# Patient Record
Sex: Female | Born: 1985 | Race: Black or African American | Hispanic: No | Marital: Single | Smoking: Former smoker
Health system: Southern US, Community
[De-identification: ages and names within clinical notes are randomized; demographics above are authoritative.]

## PROBLEM LIST (undated history)

## (undated) ENCOUNTER — Inpatient Hospital Stay (HOSPITAL_COMMUNITY): Payer: Self-pay

## (undated) ENCOUNTER — Ambulatory Visit (HOSPITAL_BASED_OUTPATIENT_CLINIC_OR_DEPARTMENT_OTHER): Discharge status: Absent without leave | Payer: Medicaid Other

## (undated) DIAGNOSIS — J45909 Unspecified asthma, uncomplicated: Secondary | ICD-10-CM

## (undated) HISTORY — PX: NO PAST SURGERIES: SHX2092

---

## 2015-09-27 NOTE — L&D Delivery Note (Signed)
Delivery Note At 5:39 PM a viable female was delivered via Vaginal, Spontaneous Delivery (Presentation: ;  ).  APGAR:9 ,9 ; weight  .   Placenta status: , .  Cord:  with the following complications: .  Cord pH: not sent  Anesthesia:  Epidural Episiotomy:  none Lacerations:  none Suture Repair: none Est. Blood Loss (mL):  200 ml  Mom to postpartum.  Baby to Couplet care / Skin to Skin.  Kathey Simer A 07/04/2016, 5:51 PM

## 2016-01-13 LAB — OB RESULTS CONSOLE HEPATITIS B SURFACE ANTIGEN: Hepatitis B Surface Ag: NEGATIVE

## 2016-01-13 LAB — OB RESULTS CONSOLE GC/CHLAMYDIA
Chlamydia: NEGATIVE
GC PROBE AMP, GENITAL: NEGATIVE

## 2016-01-13 LAB — OB RESULTS CONSOLE ABO/RH: RH Type: POSITIVE

## 2016-01-13 LAB — OB RESULTS CONSOLE RPR: RPR: NONREACTIVE

## 2016-01-13 LAB — OB RESULTS CONSOLE ANTIBODY SCREEN: Antibody Screen: NEGATIVE

## 2016-01-13 LAB — OB RESULTS CONSOLE RUBELLA ANTIBODY, IGM: Rubella: IMMUNE

## 2016-01-13 LAB — OB RESULTS CONSOLE HIV ANTIBODY (ROUTINE TESTING): HIV: NONREACTIVE

## 2016-03-21 ENCOUNTER — Other Ambulatory Visit (HOSPITAL_COMMUNITY): Payer: Self-pay | Admitting: Obstetrics and Gynecology

## 2016-03-21 ENCOUNTER — Ambulatory Visit (HOSPITAL_COMMUNITY)
Admission: RE | Admit: 2016-03-21 | Discharge: 2016-03-21 | Disposition: A | Discharge status: Home / Self Care | Payer: Medicaid Other | Source: Ambulatory Visit | Attending: Obstetrics and Gynecology | Admitting: Obstetrics and Gynecology

## 2016-03-21 ENCOUNTER — Encounter (HOSPITAL_COMMUNITY): Payer: Self-pay

## 2016-03-21 DIAGNOSIS — O359XX0 Maternal care for (suspected) fetal abnormality and damage, unspecified, not applicable or unspecified: Secondary | ICD-10-CM | POA: Diagnosis not present

## 2016-03-21 DIAGNOSIS — O0932 Supervision of pregnancy with insufficient antenatal care, second trimester: Secondary | ICD-10-CM | POA: Diagnosis not present

## 2016-03-21 DIAGNOSIS — Z3A23 23 weeks gestation of pregnancy: Secondary | ICD-10-CM

## 2016-03-21 DIAGNOSIS — Z36 Encounter for antenatal screening of mother: Secondary | ICD-10-CM | POA: Insufficient documentation

## 2016-03-21 DIAGNOSIS — O99212 Obesity complicating pregnancy, second trimester: Secondary | ICD-10-CM

## 2016-03-21 DIAGNOSIS — Z3689 Encounter for other specified antenatal screening: Secondary | ICD-10-CM

## 2016-04-06 ENCOUNTER — Encounter (HOSPITAL_COMMUNITY): Payer: Self-pay

## 2016-05-27 ENCOUNTER — Inpatient Hospital Stay (HOSPITAL_COMMUNITY)
Admission: AD | Admit: 2016-05-27 | Discharge: 2016-05-27 | Disposition: A | Discharge status: Home / Self Care | Payer: Medicaid Other | Source: Ambulatory Visit | Attending: Obstetrics and Gynecology | Admitting: Obstetrics and Gynecology

## 2016-05-27 ENCOUNTER — Encounter (HOSPITAL_COMMUNITY): Payer: Self-pay

## 2016-05-27 DIAGNOSIS — Z79899 Other long term (current) drug therapy: Secondary | ICD-10-CM | POA: Diagnosis not present

## 2016-05-27 DIAGNOSIS — Z87891 Personal history of nicotine dependence: Secondary | ICD-10-CM | POA: Insufficient documentation

## 2016-05-27 DIAGNOSIS — Z3A33 33 weeks gestation of pregnancy: Secondary | ICD-10-CM | POA: Diagnosis not present

## 2016-05-27 DIAGNOSIS — O1210 Gestational proteinuria, unspecified trimester: Secondary | ICD-10-CM

## 2016-05-27 DIAGNOSIS — O1213 Gestational proteinuria, third trimester: Secondary | ICD-10-CM | POA: Diagnosis not present

## 2016-05-27 DIAGNOSIS — Z3493 Encounter for supervision of normal pregnancy, unspecified, third trimester: Secondary | ICD-10-CM

## 2016-05-27 DIAGNOSIS — Z3689 Encounter for other specified antenatal screening: Secondary | ICD-10-CM

## 2016-05-27 DIAGNOSIS — O99513 Diseases of the respiratory system complicating pregnancy, third trimester: Secondary | ICD-10-CM | POA: Insufficient documentation

## 2016-05-27 HISTORY — DX: Unspecified asthma, uncomplicated: J45.909

## 2016-05-27 LAB — URINE MICROSCOPIC-ADD ON
BACTERIA UA: NONE SEEN
RBC / HPF: NONE SEEN RBC/hpf (ref 0–5)

## 2016-05-27 LAB — URINALYSIS, ROUTINE W REFLEX MICROSCOPIC
BILIRUBIN URINE: NEGATIVE
Glucose, UA: NEGATIVE mg/dL
Ketones, ur: 15 mg/dL — AB
Leukocytes, UA: NEGATIVE
NITRITE: NEGATIVE
PH: 7 (ref 5.0–8.0)
Protein, ur: 100 mg/dL — AB
SPECIFIC GRAVITY, URINE: 1.025 (ref 1.005–1.030)

## 2016-05-27 MED ORDER — BETAMETHASONE SOD PHOS & ACET 6 (3-3) MG/ML IJ SUSP: 12.0000 mg | Freq: Once | INTRAMUSCULAR | Status: DC

## 2016-05-27 MED ORDER — BETAMETHASONE SOD PHOS & ACET 6 (3-3) MG/ML IJ SUSP
12.0000 mg | Freq: Once | INTRAMUSCULAR | Status: AC
  Administered 2016-05-27: 12 mg via INTRAMUSCULAR
  Filled 2016-05-27: qty 2

## 2016-05-27 NOTE — MAU Note (Signed)
Had blood work yesterday was called because lab work was abnormal, no history of high blood pressure, no headache, swelling in lower extremities, was told to come in and be monitored, pain on left upper abdomen.

## 2016-05-27 NOTE — MAU Provider Note (Signed)
  History     CSN: [id number]  Arrival date and time: 05/27/16 1246   First Provider Initiated Contact with Patient 05/27/16 1351      No chief complaint on file.  [id number] @33 .1 weeks presenting after she had a message on her voicemail from the office about protein in her urine and instructed to come into hospital. She was in the office yesterday for a routine prenatal visit and urine dip was positive for protein. She reports good FM. No VB, LOF, or ctx. She denies HA, visual disturbances, and epigastric pain.   OB History    Gravida Para Term Preterm AB Living   5 3 3   1 3    SAB TAB Ectopic Multiple Live Births     1            Past Medical History:  Diagnosis Date  . Asthma     History reviewed. No pertinent surgical history.  History reviewed. No pertinent family history.  Social History  Substance Use Topics  . Smoking status: Former Smoker    Quit date: 03/26/2016  . Smokeless tobacco: Never Used  . Alcohol use No    Allergies: No Known Allergies  Prescriptions Prior to Admission  Medication Sig Dispense Refill Last Dose  . acetaminophen (TYLENOL) 500 MG tablet Take 500 mg by mouth every 6 (six) hours as needed.   05/25/2016  . albuterol (PROVENTIL HFA;VENTOLIN HFA) 108 (90 Base) MCG/ACT inhaler Inhale 2 puffs into the lungs every 6 (six) hours as needed for wheezing or shortness of breath.     . calcium carbonate (TUMS - DOSED IN MG ELEMENTAL CALCIUM) 500 MG chewable tablet Chew 1 tablet by mouth daily.   05/25/2016  . Prenatal Vit-Fe Fumarate-FA (PRENATAL MULTIVITAMIN) TABS tablet Take 1 tablet by mouth daily at 12 noon.   05/27/2016 at Unknown time    Review of Systems  Constitutional: Negative.   HENT: Negative.   Gastrointestinal: Negative.    Physical Exam   Blood pressure 114/62, pulse 99, temperature 98.3 F (36.8 C), temperature source Oral, resp. rate 16, height 5\' 7"  (1.702 m), weight 110.7 kg (244 lb 1.9 oz).  Physical Exam  Constitutional: She is  oriented to person, place, and time. She appears well-developed and well-nourished.  HENT:  Head: Normocephalic and atraumatic.  Neck: Normal range of motion. Neck supple.  Cardiovascular: Normal rate and regular rhythm.   Respiratory: Effort normal and breath sounds normal.  GI: Soft. Bowel sounds are normal. She exhibits no distension. There is no tenderness.  gravid  Musculoskeletal: Normal range of motion.  Neurological: She is alert and oriented to person, place, and time.  Skin: Skin is warm and dry.  Psychiatric: She has a normal mood and affect.  EFM: 145 bpm, mod variability, + accels, no decels Toco: none  MAU Course  Procedures Betamethasone 12 mg IM x1  MDM Labs ordered and reviewed. Dr. Stefano GaulStringer in to see pt. No evidence of Pre-e based on physical exam, BPs and yesterdays labs. Plan for BMZ today and tomorrow. Stable for discharge home  Assessment and Plan  33.[redacted] weeks gestation Reactive NST Proteinuria  Discharge home Pre-e precautions Follow up tomorrow for BMZ injection in MAU Follow up in office next week as scheduled  Donette LarryMelanie Walter Min, CNM 05/27/2016, 2:02 PM

## 2016-05-27 NOTE — MAU Provider Note (Signed)
DATE: 05/27/2016  Maternity Admissions Unit History and Physical Exam for an Obstetrics Patient  [name] is a 30 y.o. female, [id number], at 7352w1d gestation, who presents for evaluation of possible preeclampsia. [facility]  Her pregnancy has been complicated by proteinuria based on a dipstick of her urine on 05/26/2016. The patient had PIH labs drawn in the office. Her platelet count and liver enzymes were normal. Her protein to creatinine ratio was 293 (greater than or equal to 300 is diagnostic of preeclampsia). The patient was instructed to come to the hospital to monitor blood pressures and for further evaluation. The patient denies right upper quadrant tenderness. She does have persistent left lower quadrant pain. She denies headaches and blurred vision. See history below.  OB History    Gravida Para Term Preterm AB Living   5 3 3   1 3    SAB TAB Ectopic Multiple Live Births     1            Past Medical History:  Diagnosis Date  . Asthma     No prescriptions prior to admission.    History reviewed. No pertinent surgical history.  No Known Allergies  Family History: family history is not on file.  Social History:  reports that she quit smoking about 2 months ago. She has never used smokeless tobacco. She reports that she does not drink alcohol or use drugs.  Review of systems: Normal pregnancy complaints.  Admission Physical Exam:    Body mass index is 38.23 kg/m.  Blood pressure 97/71, pulse 96, temperature 98.3 F (36.8 C), temperature source Oral, resp. rate 18, height 5\' 7"  (1.702 m), weight 244 lb 1.9 oz (110.7 kg).  HEENT:                 Within normal limits Chest:                   Clear Heart:                    Regular rate and rhythm Abdomen:             Gravid and nontender Extremities:          Grossly normal Neurologic exam: Grossly  normal  NST: Category 1; Contractions: Mild and irregular .   Results for orders placed or performed during the hospital encounter of 05/27/16 (from the past 24 hour(s))  Urinalysis, Routine w reflex microscopic (not at [facility])     Status: Abnormal   Collection Time: 05/27/16  1:00 PM  Result Value Ref Range   Color, Urine YELLOW YELLOW   APPearance CLEAR CLEAR   Specific Gravity, Urine 1.025 1.005 - 1.030   pH 7.0 5.0 - 8.0   Glucose, UA NEGATIVE NEGATIVE mg/dL   Hgb urine dipstick TRACE (A) NEGATIVE   Bilirubin Urine NEGATIVE NEGATIVE   Ketones, ur 15 (A) NEGATIVE mg/dL   Protein, ur 478100 (A) NEGATIVE mg/dL   Nitrite NEGATIVE NEGATIVE   Leukocytes, UA NEGATIVE NEGATIVE  Urine microscopic-add on     Status: Abnormal   Collection Time: 05/27/16  1:00 PM  Result Value Ref Range   Squamous Epithelial / LPF 6-30 (A) NONE SEEN   WBC, UA 0-5 0 - 5 WBC/hpf   RBC / HPF NONE SEEN 0 - 5 RBC/hpf   Bacteria, UA NONE SEEN NONE SEEN   Crystals  CA OXALATE CRYSTALS (A) NEGATIVE     Assessment:  [redacted]w[redacted]d gestation  Proteinuria on a clean catch sample that did not meet criteria for preeclampsia based on labs in our office  Plan:  Preeclampsia discussed. As per caution, the patient received betamethasone today. She will have a second dose on 05/28/2016.  The patient will return to the office for evaluation on 05/31/2016. Preeclampsia precautions were reviewed.   Yolanda Johnston V 05/27/2016, 4:17 PM

## 2016-05-27 NOTE — MAU Note (Signed)
Patient states the office said something about protein in her urine.

## 2016-05-27 NOTE — Discharge Instructions (Signed)

## 2016-05-28 ENCOUNTER — Inpatient Hospital Stay (HOSPITAL_COMMUNITY)
Admission: AD | Admit: 2016-05-28 | Discharge: 2016-05-28 | Disposition: A | Discharge status: Home / Self Care | Payer: Medicaid Other | Source: Ambulatory Visit | Attending: Obstetrics & Gynecology | Admitting: Obstetrics & Gynecology

## 2016-05-28 DIAGNOSIS — R808 Other proteinuria: Secondary | ICD-10-CM | POA: Insufficient documentation

## 2016-05-28 MED ORDER — BETAMETHASONE SOD PHOS & ACET 6 (3-3) MG/ML IJ SUSP
12.0000 mg | Freq: Once | INTRAMUSCULAR | Status: AC
  Administered 2016-05-28: 12 mg via INTRAMUSCULAR
  Filled 2016-05-28: qty 2

## 2016-06-12 ENCOUNTER — Inpatient Hospital Stay (HOSPITAL_COMMUNITY): Payer: Medicaid Other

## 2016-06-12 ENCOUNTER — Inpatient Hospital Stay (HOSPITAL_COMMUNITY)
Admission: AD | Admit: 2016-06-12 | Discharge: 2016-06-12 | Disposition: A | Discharge status: Home / Self Care | Payer: Medicaid Other | Source: Ambulatory Visit | Attending: Obstetrics and Gynecology | Admitting: Obstetrics and Gynecology

## 2016-06-12 ENCOUNTER — Encounter (HOSPITAL_COMMUNITY): Payer: Self-pay

## 2016-06-12 DIAGNOSIS — O26893 Other specified pregnancy related conditions, third trimester: Secondary | ICD-10-CM | POA: Insufficient documentation

## 2016-06-12 DIAGNOSIS — Z87891 Personal history of nicotine dependence: Secondary | ICD-10-CM | POA: Insufficient documentation

## 2016-06-12 DIAGNOSIS — R1011 Right upper quadrant pain: Secondary | ICD-10-CM | POA: Insufficient documentation

## 2016-06-12 DIAGNOSIS — O9989 Other specified diseases and conditions complicating pregnancy, childbirth and the puerperium: Secondary | ICD-10-CM

## 2016-06-12 DIAGNOSIS — Z3A35 35 weeks gestation of pregnancy: Secondary | ICD-10-CM | POA: Diagnosis not present

## 2016-06-12 LAB — PROTEIN / CREATININE RATIO, URINE
CREATININE, URINE: 95 mg/dL
PROTEIN CREATININE RATIO: 0.45 mg/mg{creat} — AB (ref 0.00–0.15)
TOTAL PROTEIN, URINE: 43 mg/dL

## 2016-06-12 LAB — CBC
HCT: 31.1 % — ABNORMAL LOW (ref 36.0–46.0)
Hemoglobin: 10.3 g/dL — ABNORMAL LOW (ref 12.0–15.0)
MCH: 26.3 pg (ref 26.0–34.0)
MCHC: 33.1 g/dL (ref 30.0–36.0)
MCV: 79.5 fL (ref 78.0–100.0)
PLATELETS: 248 10*3/uL (ref 150–400)
RBC: 3.91 MIL/uL (ref 3.87–5.11)
RDW: 15.6 % — ABNORMAL HIGH (ref 11.5–15.5)
WBC: 9.2 10*3/uL (ref 4.0–10.5)

## 2016-06-12 LAB — COMPREHENSIVE METABOLIC PANEL
ALT: 15 U/L (ref 14–54)
ANION GAP: 6 (ref 5–15)
AST: 12 U/L — ABNORMAL LOW (ref 15–41)
Albumin: 2.9 g/dL — ABNORMAL LOW (ref 3.5–5.0)
Alkaline Phosphatase: 106 U/L (ref 38–126)
BUN: 7 mg/dL (ref 6–20)
CALCIUM: 9 mg/dL (ref 8.9–10.3)
CHLORIDE: 104 mmol/L (ref 101–111)
CO2: 24 mmol/L (ref 22–32)
Creatinine, Ser: 0.46 mg/dL (ref 0.44–1.00)
GFR calc non Af Amer: >60 mL/min (ref >60)
Glucose, Bld: 86 mg/dL (ref 65–99)
Potassium: 4.2 mmol/L (ref 3.5–5.1)
SODIUM: 134 mmol/L — AB (ref 135–145)
Total Bilirubin: 0.5 mg/dL (ref 0.3–1.2)
Total Protein: 6.5 g/dL (ref 6.5–8.1)

## 2016-06-12 LAB — URINALYSIS, ROUTINE W REFLEX MICROSCOPIC
BILIRUBIN URINE: NEGATIVE
GLUCOSE, UA: NEGATIVE mg/dL
KETONES UR: NEGATIVE mg/dL
Leukocytes, UA: NEGATIVE
NITRITE: NEGATIVE
PH: 6.5 (ref 5.0–8.0)
Protein, ur: NEGATIVE mg/dL
Specific Gravity, Urine: 1.01 (ref 1.005–1.030)

## 2016-06-12 LAB — URINE MICROSCOPIC-ADD ON

## 2016-06-12 MED ORDER — BUTALBITAL-APAP-CAFFEINE 50-325-40 MG PO TABS
2.0000 | ORAL_TABLET | Freq: Once | ORAL | Status: AC
  Administered 2016-06-12: 2 via ORAL
  Filled 2016-06-12: qty 2

## 2016-06-12 MED ORDER — OXYCODONE-ACETAMINOPHEN 5-325 MG PO TABS: 1.0000 | ORAL_TABLET | ORAL | 0 refills | Status: DC | PRN

## 2016-06-12 NOTE — MAU Provider Note (Signed)
History     CSN: [id number]  Arrival date and time: 06/12/16 4098   First Provider Initiated Contact with Patient 06/12/16 2033      Chief Complaint  Patient presents with  . pain under right breast   Yolanda Johnston is a 30 y.o. [id number] at [redacted]w[redacted]d who presents today with RUQ pain. She states that yesterday she had a headache that would not go away. She states that she went to bed, and then this morning the headache was gone. However now she has RUQ pain. She states that the pain started shortly after she ate a burger and fires. She denies any contractions, vaginal bleeding or LOF. She confirms normal fetal movement.    Abdominal Pain  This is a new problem. The current episode started today. The onset quality is gradual. The problem occurs constantly. The pain is located in the RUQ. The pain is at a severity of 10/10. The quality of the pain is sharp. The abdominal pain does not radiate. Pertinent negatives include no constipation, diarrhea, dysuria, fever, frequency, nausea or vomiting. Nothing aggravates the pain. The pain is relieved by nothing. She has tried nothing for the symptoms.   Past Medical History:  Diagnosis Date  . Asthma     History reviewed. No pertinent surgical history.  No family history on file.  Social History  Substance Use Topics  . Smoking status: Former Smoker    Quit date: 03/26/2016  . Smokeless tobacco: Never Used  . Alcohol use No    Allergies: No Known Allergies  Prescriptions Prior to Admission  Medication Sig Dispense Refill Last Dose  . acetaminophen (TYLENOL) 500 MG tablet Take 500 mg by mouth every 6 (six) hours as needed.   05/25/2016  . albuterol (PROVENTIL HFA;VENTOLIN HFA) 108 (90 Base) MCG/ACT inhaler Inhale 2 puffs into the lungs every 6 (six) hours as needed for wheezing or shortness of breath.     . calcium carbonate (TUMS - DOSED IN MG ELEMENTAL CALCIUM) 500 MG chewable tablet Chew 1 tablet by mouth daily.   05/25/2016  . Prenatal  Vit-Fe Fumarate-FA (PRENATAL MULTIVITAMIN) TABS tablet Take 1 tablet by mouth daily at 12 noon.   05/27/2016 at Unknown time    Review of Systems  Constitutional: Negative for chills and fever.  Gastrointestinal: Positive for abdominal pain. Negative for constipation, diarrhea, nausea and vomiting.  Genitourinary: Negative for dysuria, frequency and urgency.   Physical Exam   Blood pressure 116/63, pulse 110, temperature 98.1 F (36.7 C), temperature source Oral, resp. rate 18, height 5\' 7"  (1.702 m), weight 249 lb (112.9 kg).  Physical Exam  Nursing note and vitals reviewed. Constitutional: She is oriented to person, place, and time. She appears well-developed and well-nourished. No distress.  HENT:  Head: Normocephalic.  Cardiovascular: Normal rate.   Respiratory: Effort normal.  GI: There is tenderness (in RUQ ). There is no rebound.  Neurological: She is alert and oriented to person, place, and time.  Skin: Skin is warm and dry.  Psychiatric: She has a normal mood and affect.  FHT: 135, moderate with 15x15 accels, no decels Toco: no UCs   MAU Course  Procedures  MDM 2331: D/W Dr. Normand Sloop ok for DC home. Can have percocet to get her until her appointment on Wednesday.   Assessment and Plan   1. RUQ pain   2. [redacted] weeks gestation of pregnancy    DC home Comfort measures reviewed  Low fat diet 3rd Trimester precautions  PTL precautions  Fetal kick counts RX: percocet PRN #10  Return to MAU as needed FU with OB as planned  Follow-up Information    [facility] .   Specialty:  Obstetrics and Gynecology Contact information: [address] [place] [phone]           Tawnya CrookHogan, [place] Biehn Donovan 06/12/2016, 8:35 PM

## 2016-06-12 NOTE — Discharge Instructions (Signed)
Low-Fat Diet for Pancreatitis or Gallbladder Conditions °A low-fat diet can be helpful if you have pancreatitis or a gallbladder condition. With these conditions, your pancreas and gallbladder have trouble digesting fats. A healthy eating plan with less fat will help rest your pancreas and gallbladder and reduce your symptoms. °WHAT DO I NEED TO KNOW ABOUT THIS DIET? °· Eat a low-fat diet. °¨ Reduce your fat intake to less than 20-30% of your total daily calories. This is less than 50-60 g of fat per day. °¨ Remember that you need some fat in your diet. Ask your dietician what your daily goal should be. °¨ Choose nonfat and low-fat healthy foods. Look for the words "nonfat," "low fat," or "fat free." °¨ As a guide, look on the label and choose foods with less than 3 g of fat per serving. Eat only one serving. °· Avoid alcohol. °· Do not smoke. If you need help quitting, talk with your health care provider. °· Eat small frequent meals instead of three large heavy meals. °WHAT FOODS CAN I EAT? °Grains °Include healthy grains and starches such as potatoes, wheat bread, fiber-rich cereal, and brown rice. Choose whole grain options whenever possible. In adults, whole grains should account for 45-65% of your daily calories.  °Fruits and Vegetables °Eat plenty of fruits and vegetables. Fresh fruits and vegetables add fiber to your diet. °Meats and Other Protein Sources °Eat lean meat such as chicken and pork. Trim any fat off of meat before cooking it. Eggs, fish, and beans are other sources of protein. In adults, these foods should account for 10-35% of your daily calories. °Dairy °Choose low-fat milk and dairy options. Dairy includes fat and protein, as well as calcium.  °Fats and Oils °Limit high-fat foods such as fried foods, sweets, baked goods, sugary drinks.  °Other °Creamy sauces and condiments, such as mayonnaise, can add extra fat. Think about whether or not you need to use them, or use smaller amounts or low fat  options. °WHAT FOODS ARE NOT RECOMMENDED? °· High fat foods, such as: °¨ Baked goods. °¨ Ice cream. °¨ French toast. °¨ Sweet rolls. °¨ Pizza. °¨ Cheese bread. °¨ Foods covered with batter, butter, creamy sauces, or cheese. °¨ Fried foods. °¨ Sugary drinks and desserts. °· Foods that cause gas or bloating °  °This information is not intended to replace advice given to you by your health care provider. Make sure you discuss any questions you have with your health care provider. °  °Document Released: 09/17/2013 Document Reviewed: 09/17/2013 °Elsevier Interactive Patient Education ©2016 [employer] ° °

## 2016-06-12 NOTE — MAU Note (Signed)
Pt here with c/o pain under her right breast. Had headache off and on all day yesterday but denies HA today.  Started hurting a couple of hours ago. Had a 24 hour urine collection last week and "protein was high". Denies any bleeding or leaking of fluid. Reports positive fetal movement.

## 2016-06-15 LAB — OB RESULTS CONSOLE GBS: GBS: NEGATIVE

## 2016-07-04 ENCOUNTER — Encounter (HOSPITAL_COMMUNITY): Payer: Self-pay

## 2016-07-04 ENCOUNTER — Inpatient Hospital Stay (HOSPITAL_COMMUNITY): Payer: Medicaid Other | Admitting: Anesthesiology

## 2016-07-04 ENCOUNTER — Inpatient Hospital Stay (HOSPITAL_COMMUNITY)
Admission: AD | Admit: 2016-07-04 | Discharge: 2016-07-04 | Disposition: A | Discharge status: Home / Self Care | Payer: Medicaid Other | Source: Ambulatory Visit | Attending: Obstetrics & Gynecology | Admitting: Obstetrics & Gynecology

## 2016-07-04 ENCOUNTER — Inpatient Hospital Stay (HOSPITAL_COMMUNITY)
Admission: AD | Admit: 2016-07-04 | Discharge: 2016-07-06 | DRG: 767 | Disposition: A | Discharge status: Home / Self Care | Payer: Medicaid Other | Source: Ambulatory Visit | Attending: Obstetrics and Gynecology | Admitting: Obstetrics and Gynecology

## 2016-07-04 DIAGNOSIS — Z302 Encounter for sterilization: Secondary | ICD-10-CM

## 2016-07-04 DIAGNOSIS — Z3483 Encounter for supervision of other normal pregnancy, third trimester: Secondary | ICD-10-CM | POA: Diagnosis present

## 2016-07-04 DIAGNOSIS — Z3A38 38 weeks gestation of pregnancy: Secondary | ICD-10-CM

## 2016-07-04 DIAGNOSIS — O9952 Diseases of the respiratory system complicating childbirth: Principal | ICD-10-CM | POA: Diagnosis present

## 2016-07-04 DIAGNOSIS — J45909 Unspecified asthma, uncomplicated: Secondary | ICD-10-CM | POA: Diagnosis present

## 2016-07-04 DIAGNOSIS — Z87891 Personal history of nicotine dependence: Secondary | ICD-10-CM | POA: Diagnosis not present

## 2016-07-04 LAB — TYPE AND SCREEN
ABO/RH(D): A POS
Antibody Screen: NEGATIVE

## 2016-07-04 LAB — CBC
HCT: 35.2 % — ABNORMAL LOW (ref 36.0–46.0)
Hemoglobin: 11.9 g/dL — ABNORMAL LOW (ref 12.0–15.0)
MCH: 26.9 pg (ref 26.0–34.0)
MCHC: 33.8 g/dL (ref 30.0–36.0)
MCV: 79.6 fL (ref 78.0–100.0)
PLATELETS: 319 10*3/uL (ref 150–400)
RBC: 4.42 MIL/uL (ref 3.87–5.11)
RDW: 15.8 % — AB (ref 11.5–15.5)
WBC: 9.1 10*3/uL (ref 4.0–10.5)

## 2016-07-04 LAB — ABO/RH: ABO/RH(D): A POS

## 2016-07-04 MED ORDER — ONDANSETRON HCL 4 MG/2ML IJ SOLN: 4.0000 mg | INTRAMUSCULAR | Status: DC | PRN

## 2016-07-04 MED ORDER — LACTATED RINGERS IV SOLN
INTRAVENOUS | Status: DC
  Administered 2016-07-04: 12:00:00 via INTRAVENOUS

## 2016-07-04 MED ORDER — CALCIUM CARBONATE ANTACID 500 MG PO CHEW
400.0000 mg | CHEWABLE_TABLET | ORAL | Status: DC | PRN
  Administered 2016-07-04: 400 mg via ORAL
  Filled 2016-07-04: qty 2

## 2016-07-04 MED ORDER — WITCH HAZEL-GLYCERIN EX PADS: 1.0000 "application " | MEDICATED_PAD | CUTANEOUS | Status: DC | PRN

## 2016-07-04 MED ORDER — ONDANSETRON HCL 4 MG/2ML IJ SOLN: 4.0000 mg | Freq: Four times a day (QID) | INTRAMUSCULAR | Status: DC | PRN

## 2016-07-04 MED ORDER — EPHEDRINE 5 MG/ML INJ
10.0000 mg | INTRAVENOUS | Status: DC | PRN
  Filled 2016-07-04: qty 4

## 2016-07-04 MED ORDER — PHENYLEPHRINE 40 MCG/ML (10ML) SYRINGE FOR IV PUSH (FOR BLOOD PRESSURE SUPPORT)
80.0000 ug | PREFILLED_SYRINGE | INTRAVENOUS | Status: DC | PRN
  Filled 2016-07-04: qty 5

## 2016-07-04 MED ORDER — DEXTROSE IN LACTATED RINGERS 5 % IV SOLN: INTRAVENOUS | Status: DC

## 2016-07-04 MED ORDER — SIMETHICONE 80 MG PO CHEW
80.0000 mg | CHEWABLE_TABLET | ORAL | Status: DC | PRN
  Administered 2016-07-05: 80 mg via ORAL
  Filled 2016-07-04: qty 1

## 2016-07-04 MED ORDER — OXYTOCIN 40 UNITS IN LACTATED RINGERS INFUSION - SIMPLE MED
1.0000 m[IU]/min | INTRAVENOUS | Status: DC
  Administered 2016-07-04: 2 m[IU]/min via INTRAVENOUS

## 2016-07-04 MED ORDER — FENTANYL 2.5 MCG/ML BUPIVACAINE 1/10 % EPIDURAL INFUSION (WH - ANES)
14.0000 mL/h | INTRAMUSCULAR | Status: DC | PRN
  Administered 2016-07-04 (×2): 14 mL/h via EPIDURAL
  Filled 2016-07-04: qty 125

## 2016-07-04 MED ORDER — ACETAMINOPHEN 325 MG PO TABS
650.0000 mg | ORAL_TABLET | ORAL | Status: DC | PRN
  Administered 2016-07-05: 650 mg via ORAL
  Filled 2016-07-04: qty 2

## 2016-07-04 MED ORDER — DIBUCAINE 1 % RE OINT: 1.0000 "application " | TOPICAL_OINTMENT | RECTAL | Status: DC | PRN

## 2016-07-04 MED ORDER — OXYTOCIN 40 UNITS IN LACTATED RINGERS INFUSION - SIMPLE MED
2.5000 [IU]/h | INTRAVENOUS | Status: DC
  Filled 2016-07-04: qty 1000

## 2016-07-04 MED ORDER — ZOLPIDEM TARTRATE 5 MG PO TABS: 5.0000 mg | ORAL_TABLET | Freq: Every evening | ORAL | Status: DC | PRN

## 2016-07-04 MED ORDER — DIPHENHYDRAMINE HCL 50 MG/ML IJ SOLN
12.5000 mg | INTRAMUSCULAR | Status: DC | PRN
  Administered 2016-07-04 (×2): 12.5 mg via INTRAVENOUS
  Filled 2016-07-04: qty 1

## 2016-07-04 MED ORDER — FENTANYL CITRATE (PF) 100 MCG/2ML IJ SOLN
100.0000 ug | Freq: Once | INTRAMUSCULAR | Status: AC
  Administered 2016-07-04: 100 ug via INTRAVENOUS
  Filled 2016-07-04: qty 2

## 2016-07-04 MED ORDER — SENNOSIDES-DOCUSATE SODIUM 8.6-50 MG PO TABS
2.0000 | ORAL_TABLET | ORAL | Status: DC
  Administered 2016-07-04: 2 via ORAL
  Filled 2016-07-04: qty 2

## 2016-07-04 MED ORDER — LACTATED RINGERS IV SOLN
500.0000 mL | Freq: Once | INTRAVENOUS | Status: AC
  Administered 2016-07-04: 500 mL via INTRAVENOUS

## 2016-07-04 MED ORDER — ACETAMINOPHEN 325 MG PO TABS: 650.0000 mg | ORAL_TABLET | ORAL | Status: DC | PRN

## 2016-07-04 MED ORDER — BENZOCAINE-MENTHOL 20-0.5 % EX AERO: 1.0000 "application " | INHALATION_SPRAY | CUTANEOUS | Status: DC | PRN

## 2016-07-04 MED ORDER — MEASLES, MUMPS & RUBELLA VAC ~~LOC~~ INJ: 0.5000 mL | INJECTION | Freq: Once | SUBCUTANEOUS | Status: DC

## 2016-07-04 MED ORDER — ONDANSETRON HCL 4 MG PO TABS
4.0000 mg | ORAL_TABLET | ORAL | Status: DC | PRN
  Administered 2016-07-05 (×2): 4 mg via ORAL
  Filled 2016-07-04: qty 1

## 2016-07-04 MED ORDER — TETANUS-DIPHTH-ACELL PERTUSSIS 5-2.5-18.5 LF-MCG/0.5 IM SUSP: 0.5000 mL | Freq: Once | INTRAMUSCULAR | Status: DC

## 2016-07-04 MED ORDER — ALBUTEROL SULFATE (2.5 MG/3ML) 0.083% IN NEBU
3.0000 mL | INHALATION_SOLUTION | Freq: Four times a day (QID) | RESPIRATORY_TRACT | Status: DC | PRN
  Administered 2016-07-05: 3 mL via RESPIRATORY_TRACT
  Filled 2016-07-04: qty 3

## 2016-07-04 MED ORDER — PRENATAL MULTIVITAMIN CH: 1.0000 | ORAL_TABLET | Freq: Every day | ORAL | Status: DC

## 2016-07-04 MED ORDER — DIPHENHYDRAMINE HCL 25 MG PO CAPS: 25.0000 mg | ORAL_CAPSULE | Freq: Four times a day (QID) | ORAL | Status: DC | PRN

## 2016-07-04 MED ORDER — TERBUTALINE SULFATE 1 MG/ML IJ SOLN
0.2500 mg | Freq: Once | INTRAMUSCULAR | Status: DC | PRN
  Filled 2016-07-04: qty 1

## 2016-07-04 MED ORDER — SOD CITRATE-CITRIC ACID 500-334 MG/5ML PO SOLN: 30.0000 mL | ORAL | Status: DC | PRN

## 2016-07-04 MED ORDER — LIDOCAINE HCL (PF) 1 % IJ SOLN
30.0000 mL | INTRAMUSCULAR | Status: DC | PRN
  Filled 2016-07-04: qty 30

## 2016-07-04 MED ORDER — FLEET ENEMA 7-19 GM/118ML RE ENEM: 1.0000 | ENEMA | RECTAL | Status: DC | PRN

## 2016-07-04 MED ORDER — COCONUT OIL OIL: 1.0000 "application " | TOPICAL_OIL | Status: DC | PRN

## 2016-07-04 MED ORDER — PHENYLEPHRINE 40 MCG/ML (10ML) SYRINGE FOR IV PUSH (FOR BLOOD PRESSURE SUPPORT)
80.0000 ug | PREFILLED_SYRINGE | INTRAVENOUS | Status: DC | PRN
  Filled 2016-07-04: qty 5
  Filled 2016-07-04: qty 10

## 2016-07-04 MED ORDER — OXYCODONE-ACETAMINOPHEN 5-325 MG PO TABS: 2.0000 | ORAL_TABLET | ORAL | Status: DC | PRN

## 2016-07-04 MED ORDER — LACTATED RINGERS IV SOLN: 500.0000 mL | INTRAVENOUS | Status: DC | PRN

## 2016-07-04 MED ORDER — IBUPROFEN 600 MG PO TABS
600.0000 mg | ORAL_TABLET | Freq: Four times a day (QID) | ORAL | Status: DC
  Administered 2016-07-04 – 2016-07-06 (×5): 600 mg via ORAL
  Filled 2016-07-04 (×5): qty 1

## 2016-07-04 MED ORDER — OXYCODONE-ACETAMINOPHEN 5-325 MG PO TABS: 1.0000 | ORAL_TABLET | ORAL | Status: DC | PRN

## 2016-07-04 MED ORDER — LIDOCAINE HCL (PF) 1 % IJ SOLN
INTRAMUSCULAR | Status: DC | PRN
  Administered 2016-07-04 (×2): 4 mL

## 2016-07-04 MED ORDER — OXYTOCIN BOLUS FROM INFUSION: 500.0000 mL | Freq: Once | INTRAVENOUS | Status: DC

## 2016-07-04 NOTE — Progress Notes (Signed)
Patient ID: Yolanda Johnston, female   DOB: 26-Jul-1986, 30 y.o.   MRN: [id number]  Pt feels pressure BP 131/62   Pulse 93   Temp 97.5 F (36.4 C)   Resp 18   Ht 5\' 7"  (1.702 m)   Wt 249 lb (112.9 kg)   SpO2 100%   BMI 39.00 kg/m  Cat 1 Ctx q 10 minutes cx 8-9/80/-2 Will do pitocin augmentation.  No cx change in one hour

## 2016-07-04 NOTE — MAU Note (Signed)
Pt reports contractions and pressure like she needs to have BM. +FM. Denies LOF or vag bleeding. States she was 4cm on last exam.

## 2016-07-04 NOTE — Anesthesia Preprocedure Evaluation (Signed)
Anesthesia Evaluation  Patient identified by MRN, date of birth, ID band Patient awake    Reviewed: Allergy & Precautions, NPO status , Patient's Chart, lab work & pertinent test results  History of Anesthesia Complications Negative for: history of anesthetic complications  Airway Mallampati: II  TM Distance: >3 FB Neck ROM: Full    Dental no notable dental hx. (+) Dental Advisory Given   Pulmonary asthma , former smoker,    Pulmonary exam normal breath sounds clear to auscultation       Cardiovascular negative cardio ROS Normal cardiovascular exam Rhythm:Regular Rate:Normal     Neuro/Psych negative neurological ROS  negative psych ROS   GI/Hepatic negative GI ROS, Neg liver ROS,   Endo/Other  obesity  Renal/GU negative Renal ROS  negative genitourinary   Musculoskeletal negative musculoskeletal ROS (+)   Abdominal   Peds negative pediatric ROS (+)  Hematology negative hematology ROS (+)   Anesthesia Other Findings   Reproductive/Obstetrics (+) Pregnancy                             Anesthesia Physical Anesthesia Plan  ASA: II  Anesthesia Plan: Epidural   Post-op Pain Management:    Induction:   Airway Management Planned:   Additional Equipment:   Intra-op Plan:   Post-operative Plan:   Informed Consent: I have reviewed the patients History and Physical, chart, labs and discussed the procedure including the risks, benefits and alternatives for the proposed anesthesia with the patient or authorized representative who has indicated his/her understanding and acceptance.   Dental advisory given  Plan Discussed with: CRNA  Anesthesia Plan Comments:         Anesthesia Quick Evaluation

## 2016-07-04 NOTE — Discharge Instructions (Signed)
Braxton Hicks Contractions °Contractions of the uterus can occur throughout pregnancy. Contractions are not always a sign that you are in labor.  °WHAT ARE BRAXTON HICKS CONTRACTIONS?  °Contractions that occur before labor are called Braxton Hicks contractions, or false labor. Toward the end of pregnancy (32-34 weeks), these contractions can develop more often and may become more forceful. This is not true labor because these contractions do not result in opening (dilatation) and thinning of the cervix. They are sometimes difficult to tell apart from true labor because these contractions can be forceful and people have different pain tolerances. You should not feel embarrassed if you go to the hospital with false labor. Sometimes, the only way to tell if you are in true labor is for your health care provider to look for changes in the cervix. °If there are no prenatal problems or other health problems associated with the pregnancy, it is completely safe to be sent home with false labor and await the onset of true labor. °HOW CAN YOU TELL THE DIFFERENCE BETWEEN TRUE AND FALSE LABOR? °False Labor °· The contractions of false labor are usually shorter and not as hard as those of true labor.   °· The contractions are usually irregular.   °· The contractions are often felt in the front of the lower abdomen and in the groin.   °· The contractions may go away when you walk around or change positions while lying down.   °· The contractions get weaker and are shorter lasting as time goes on.   °· The contractions do not usually become progressively stronger, regular, and closer together as with true labor.   °True Labor °· Contractions in true labor last 30-70 seconds, become very regular, usually become more intense, and increase in frequency.   °· The contractions do not go away with walking.   °· The discomfort is usually felt in the top of the uterus and spreads to the lower abdomen and low back.   °· True labor can be  determined by your health care provider with an exam. This will show that the cervix is dilating and getting thinner.   °WHAT TO REMEMBER °· Keep up with your usual exercises and follow other instructions given by your health care provider.   °· Take medicines as directed by your health care provider.   °· Keep your regular prenatal appointments.   °· Eat and drink lightly if you think you are going into labor.   °· If Braxton Hicks contractions are making you uncomfortable:   °¨ Change your position from lying down or resting to walking, or from walking to resting.   °¨ Sit and rest in a tub of warm water.   °¨ Drink 2-3 glasses of water. Dehydration may cause these contractions.   °¨ Do slow and deep breathing several times an hour.   °WHEN SHOULD I SEEK IMMEDIATE MEDICAL CARE? °Seek immediate medical care if: °· Your contractions become stronger, more regular, and closer together.   °· You have fluid leaking or gushing from your vagina.   °· You have a fever.   °· You pass blood-tinged mucus.   °· You have vaginal bleeding.   °· You have continuous abdominal pain.   °· You have low back pain that you never had before.   °· You feel your baby's head pushing down and causing pelvic pressure.   °· Your baby is not moving as much as it used to.   °  °This information is not intended to replace advice given to you by your health care provider. Make sure you discuss any questions you have with your health care   provider. °  °Document Released: 09/12/2005 Document Revised: 09/17/2013 Document Reviewed: 06/24/2013 °Elsevier Interactive Patient Education ©2016 [employer] °Fetal Movement Counts °Patient Name: __________________________________________________ Patient Due Date: ____________________ °Performing a fetal movement count is highly recommended in high-risk pregnancies, but it is good for every pregnant woman to do. Your health care provider may ask you to start counting fetal movements at 28 weeks of the  pregnancy. Fetal movements often increase: °· After eating a full meal. °· After physical activity. °· After eating or drinking something sweet or cold. °· At rest. °Pay attention to when you feel the baby is most active. This will help you notice a pattern of your baby's sleep and wake cycles and what factors contribute to an increase in fetal movement. It is important to perform a fetal movement count at the same time each day when your baby is normally most active.  °HOW TO COUNT FETAL MOVEMENTS °1. Find a quiet and comfortable area to sit or lie down on your left side. Lying on your left side provides the best blood and oxygen circulation to your baby. °2. Write down the day and time on a sheet of paper or in a journal. °3. Start counting kicks, flutters, swishes, rolls, or jabs in a 2-hour period. You should feel at least 10 movements within 2 hours. °4. If you do not feel 10 movements in 2 hours, wait 2-3 hours and count again. Look for a change in the pattern or not enough counts in 2 hours. °SEEK MEDICAL CARE IF: °· You feel less than 10 counts in 2 hours, tried twice. °· There is no movement in over an hour. °· The pattern is changing or taking longer each day to reach 10 counts in 2 hours. °· You feel the baby is not moving as he or she usually does. °Date: ____________ Movements: ____________ Start time: ____________ Finish time: ____________  °Date: ____________ Movements: ____________ Start time: ____________ Finish time: ____________ °Date: ____________ Movements: ____________ Start time: ____________ Finish time: ____________ °Date: ____________ Movements: ____________ Start time: ____________ Finish time: ____________ °Date: ____________ Movements: ____________ Start time: ____________ Finish time: ____________ °Date: ____________ Movements: ____________ Start time: ____________ Finish time: ____________ °Date: ____________ Movements: ____________ Start time: ____________ Finish time:  ____________ °Date: ____________ Movements: ____________ Start time: ____________ Finish time: ____________  °Date: ____________ Movements: ____________ Start time: ____________ Finish time: ____________ °Date: ____________ Movements: ____________ Start time: ____________ Finish time: ____________ °Date: ____________ Movements: ____________ Start time: ____________ Finish time: ____________ °Date: ____________ Movements: ____________ Start time: ____________ Finish time: ____________ °Date: ____________ Movements: ____________ Start time: ____________ Finish time: ____________ °Date: ____________ Movements: ____________ Start time: ____________ Finish time: ____________ °Date: ____________ Movements: ____________ Start time: ____________ Finish time: ____________  °Date: ____________ Movements: ____________ Start time: ____________ Finish time: ____________ °Date: ____________ Movements: ____________ Start time: ____________ Finish time: ____________ °Date: ____________ Movements: ____________ Start time: ____________ Finish time: ____________ °Date: ____________ Movements: ____________ Start time: ____________ Finish time: ____________ °Date: ____________ Movements: ____________ Start time: ____________ Finish time: ____________ °Date: ____________ Movements: ____________ Start time: ____________ Finish time: ____________ °Date: ____________ Movements: ____________ Start time: ____________ Finish time: ____________  °Date: ____________ Movements: ____________ Start time: ____________ Finish time: ____________ °Date: ____________ Movements: ____________ Start time: ____________ Finish time: ____________ °Date: ____________ Movements: ____________ Start time: ____________ Finish time: ____________ °Date: ____________ Movements: ____________ Start time: ____________ Finish time: ____________ °Date: ____________ Movements: ____________ Start time: ____________ Finish time: ____________ °Date: ____________ Movements:  ____________ Start time: ____________ Finish time:   ____________ °Date: ____________ Movements: ____________ Start time: ____________ Finish time: ____________  °Date: ____________ Movements: ____________ Start time: ____________ Finish time: ____________ °Date: ____________ Movements: ____________ Start time: ____________ Finish time: ____________ °Date: ____________ Movements: ____________ Start time: ____________ Finish time: ____________ °Date: ____________ Movements: ____________ Start time: ____________ Finish time: ____________ °Date: ____________ Movements: ____________ Start time: ____________ Finish time: ____________ °Date: ____________ Movements: ____________ Start time: ____________ Finish time: ____________ °Date: ____________ Movements: ____________ Start time: ____________ Finish time: ____________  °Date: ____________ Movements: ____________ Start time: ____________ Finish time: ____________ °Date: ____________ Movements: ____________ Start time: ____________ Finish time: ____________ °Date: ____________ Movements: ____________ Start time: ____________ Finish time: ____________ °Date: ____________ Movements: ____________ Start time: ____________ Finish time: ____________ °Date: ____________ Movements: ____________ Start time: ____________ Finish time: ____________ °Date: ____________ Movements: ____________ Start time: ____________ Finish time: ____________ °Date: ____________ Movements: ____________ Start time: ____________ Finish time: ____________  °Date: ____________ Movements: ____________ Start time: ____________ Finish time: ____________ °Date: ____________ Movements: ____________ Start time: ____________ Finish time: ____________ °Date: ____________ Movements: ____________ Start time: ____________ Finish time: ____________ °Date: ____________ Movements: ____________ Start time: ____________ Finish time: ____________ °Date: ____________ Movements: ____________ Start time: ____________ Finish  time: ____________ °Date: ____________ Movements: ____________ Start time: ____________ Finish time: ____________ °Date: ____________ Movements: ____________ Start time: ____________ Finish time: ____________  °Date: ____________ Movements: ____________ Start time: ____________ Finish time: ____________ °Date: ____________ Movements: ____________ Start time: ____________ Finish time: ____________ °Date: ____________ Movements: ____________ Start time: ____________ Finish time: ____________ °Date: ____________ Movements: ____________ Start time: ____________ Finish time: ____________ °Date: ____________ Movements: ____________ Start time: ____________ Finish time: ____________ °Date: ____________ Movements: ____________ Start time: ____________ Finish time: ____________ °  °This information is not intended to replace advice given to you by your health care provider. Make sure you discuss any questions you have with your health care provider. °  °Document Released: 10/12/2006 Document Revised: 10/03/2014 Document Reviewed: 07/09/2012 °Elsevier Interactive Patient Education ©2016 [employer] ° °

## 2016-07-04 NOTE — MAU Note (Signed)
Contractions have gotten worse since she left MAU this morning

## 2016-07-04 NOTE — MAU Note (Signed)
Pt presents complaining of contractions every minutes since 2am. Denies leaking or bleeding. Reports good fetal movement.

## 2016-07-04 NOTE — H&P (Signed)
Yolanda Johnston is Johnston 30 y.o. female presenting for labor. OB History    Gravida Para Term Preterm AB Living   5 3 3   1 3    SAB TAB Ectopic Multiple Live Births     1           Past Medical History:  Diagnosis Date  . Asthma    Past Surgical History:  Procedure Laterality Date  . NO PAST SURGERIES     Family History: family history is not on file. Social History:  reports that she quit smoking about 3 months ago. She has never used smokeless tobacco. She reports that she does not drink alcohol or use drugs.     Maternal Diabetes: No Genetic Screening: Declined Maternal Ultrasounds/Referrals: Normal Fetal Ultrasounds or other Referrals:  None Maternal Substance Abuse:  No Significant Maternal Medications:  None Significant Maternal Lab Results:  None Other Comments:  None  ROS History Dilation: 6 Effacement (%): 90 Station: -2 Exam by:: dr Read Bonelli Blood pressure 113/82, pulse 93, temperature 97.5 F (36.4 C), resp. rate 20, height 5\' 7"  (1.702 m), weight 249 lb (112.9 kg), SpO2 100 %. Exam Physical Exam  Physical Examination: General appearance - alert, well appearing, and in no distress Chest - clear to auscultation, no wheezes, rales or rhonchi, symmetric air entry Heart - normal rate and regular rhythm Abdomen - soft, nontender, nondistended, no masses or organomegaly gravid Extremities - Homan's sign negative bilaterally Prenatal labs: ABO, Rh: --/--/Johnston POS (10/09 1140) Antibody: NEG (10/09 1140) Rubella: Immune (04/19 0000) RPR: Nonreactive (04/19 0000)  HBsAg: Negative (04/19 0000)  HIV: Non-reactive (04/19 0000)  GBS: Negative (09/20 0000)   Assessment/Plan: Term in active labor Anticipate SVD AROM clear fluid   Yolanda Johnston 07/04/2016, 2:19 PM

## 2016-07-04 NOTE — Anesthesia Procedure Notes (Signed)
Epidural Patient location during procedure: OB  Staffing Anesthesiologist: Robbin Escher Performed: anesthesiologist   Preanesthetic Checklist Completed: patient identified, site marked, surgical consent, pre-op evaluation, timeout performed, IV checked, risks and benefits discussed and monitors and equipment checked  Epidural Patient position: sitting Prep: site prepped and draped and DuraPrep Patient monitoring: continuous pulse ox and blood pressure Approach: midline Location: L3-L4 Injection technique: LOR saline  Needle:  Needle type: Tuohy  Needle gauge: 17 G Needle length: 9 cm and 9 Needle insertion depth: 5 cm cm Catheter type: closed end flexible Catheter size: 19 Gauge Catheter at skin depth: 10 cm Test dose: negative  Assessment Events: blood not aspirated, injection not painful, no injection resistance, negative IV test and no paresthesia  Additional Notes Patient identified. Risks/Benefits/Options discussed with patient including but not limited to bleeding, infection, nerve damage, paralysis, failed block, incomplete pain control, headache, blood pressure changes, nausea, vomiting, reactions to medication both or allergic, itching and postpartum back pain. Confirmed with bedside nurse the patient's most recent platelet count. Confirmed with patient that they are not currently taking any anticoagulation, have any bleeding history or any family history of bleeding disorders. Patient expressed understanding and wished to proceed. All questions were answered. Sterile technique was used throughout the entire procedure. Please see nursing notes for vital signs. Test dose was given through epidural catheter and negative prior to continuing to dose epidural or start infusion. Warning signs of high block given to the patient including shortness of breath, tingling/numbness in hands, complete motor block, or any concerning symptoms with instructions to call for help. Patient was  given instructions on fall risk and not to get out of bed. All questions and concerns addressed with instructions to call with any issues or inadequate analgesia.        

## 2016-07-05 ENCOUNTER — Inpatient Hospital Stay (HOSPITAL_COMMUNITY): Payer: Medicaid Other | Admitting: Certified Registered Nurse Anesthetist

## 2016-07-05 ENCOUNTER — Encounter (HOSPITAL_COMMUNITY)
Admission: AD | Disposition: A | Discharge status: Home / Self Care | Payer: Self-pay | Source: Ambulatory Visit | Attending: Obstetrics and Gynecology

## 2016-07-05 HISTORY — PX: TUBAL LIGATION: SHX77

## 2016-07-05 LAB — CBC
HCT: 32.4 % — ABNORMAL LOW (ref 36.0–46.0)
HEMOGLOBIN: 10.8 g/dL — AB (ref 12.0–15.0)
MCH: 26.5 pg (ref 26.0–34.0)
MCHC: 33.3 g/dL (ref 30.0–36.0)
MCV: 79.6 fL (ref 78.0–100.0)
Platelets: 250 10*3/uL (ref 150–400)
RBC: 4.07 MIL/uL (ref 3.87–5.11)
RDW: 15.8 % — AB (ref 11.5–15.5)
WBC: 11.2 10*3/uL — ABNORMAL HIGH (ref 4.0–10.5)

## 2016-07-05 LAB — RPR: RPR: NONREACTIVE

## 2016-07-05 SURGERY — LIGATION, FALLOPIAN TUBE, POSTPARTUM
Anesthesia: Epidural | Site: Abdomen

## 2016-07-05 MED ORDER — MIDAZOLAM HCL 2 MG/2ML IJ SOLN
INTRAMUSCULAR | Status: DC | PRN
  Administered 2016-07-05 (×2): 1 mg via INTRAVENOUS

## 2016-07-05 MED ORDER — FENTANYL CITRATE (PF) 100 MCG/2ML IJ SOLN
INTRAMUSCULAR | Status: DC | PRN
  Administered 2016-07-05 (×5): 50 ug via INTRAVENOUS

## 2016-07-05 MED ORDER — HYDROMORPHONE HCL 1 MG/ML IJ SOLN
0.2500 mg | INTRAMUSCULAR | Status: DC | PRN
  Administered 2016-07-05: 0.5 mg via INTRAVENOUS

## 2016-07-05 MED ORDER — LIDOCAINE-EPINEPHRINE (PF) 2 %-1:200000 IJ SOLN
INTRAMUSCULAR | Status: AC
  Filled 2016-07-05: qty 40

## 2016-07-05 MED ORDER — MEPERIDINE HCL 25 MG/ML IJ SOLN: 6.2500 mg | INTRAMUSCULAR | Status: DC | PRN

## 2016-07-05 MED ORDER — SODIUM BICARBONATE 8.4 % IV SOLN
INTRAVENOUS | Status: DC | PRN
  Administered 2016-07-05: 10 mL via EPIDURAL
  Administered 2016-07-05 (×2): 5 mL via EPIDURAL
  Administered 2016-07-05: 10 mL via EPIDURAL

## 2016-07-05 MED ORDER — DOCUSATE SODIUM 100 MG PO CAPS
200.0000 mg | ORAL_CAPSULE | Freq: Every day | ORAL | Status: DC
  Administered 2016-07-05: 200 mg via ORAL
  Filled 2016-07-05: qty 2

## 2016-07-05 MED ORDER — MIDAZOLAM HCL 2 MG/2ML IJ SOLN
INTRAMUSCULAR | Status: AC
Start: 2016-07-05 — End: 2016-07-05
  Filled 2016-07-05: qty 4

## 2016-07-05 MED ORDER — BUPIVACAINE HCL (PF) 0.25 % IJ SOLN
INTRAMUSCULAR | Status: DC | PRN
  Administered 2016-07-05: 10 mL

## 2016-07-05 MED ORDER — OXYCODONE-ACETAMINOPHEN 5-325 MG PO TABS
2.0000 | ORAL_TABLET | ORAL | Status: DC | PRN
  Administered 2016-07-05 – 2016-07-06 (×3): 2 via ORAL
  Filled 2016-07-05 (×3): qty 2

## 2016-07-05 MED ORDER — LACTATED RINGERS IV SOLN
INTRAVENOUS | Status: DC | PRN
  Administered 2016-07-05 (×2): via INTRAVENOUS

## 2016-07-05 MED ORDER — FLEET ENEMA 7-19 GM/118ML RE ENEM
1.0000 | ENEMA | Freq: Once | RECTAL | Status: AC
  Administered 2016-07-05: 1 via RECTAL

## 2016-07-05 MED ORDER — ONDANSETRON HCL 4 MG/2ML IJ SOLN
4.0000 mg | Freq: Once | INTRAMUSCULAR | Status: DC | PRN
Start: 2016-07-05 — End: 2016-07-05

## 2016-07-05 MED ORDER — HYDROMORPHONE HCL 1 MG/ML IJ SOLN
INTRAMUSCULAR | Status: AC
  Filled 2016-07-05: qty 1

## 2016-07-05 MED ORDER — METOCLOPRAMIDE HCL 10 MG PO TABS
10.0000 mg | ORAL_TABLET | Freq: Once | ORAL | Status: AC
  Administered 2016-07-05: 10 mg via ORAL
  Filled 2016-07-05: qty 1

## 2016-07-05 MED ORDER — FENTANYL CITRATE (PF) 250 MCG/5ML IJ SOLN
INTRAMUSCULAR | Status: AC
  Filled 2016-07-05: qty 10

## 2016-07-05 MED ORDER — LACTATED RINGERS IV SOLN
INTRAVENOUS | Status: DC
  Administered 2016-07-05: 08:00:00 via INTRAVENOUS

## 2016-07-05 MED ORDER — FAMOTIDINE 20 MG PO TABS
40.0000 mg | ORAL_TABLET | Freq: Once | ORAL | Status: AC
  Administered 2016-07-05: 40 mg via ORAL
  Filled 2016-07-05: qty 2

## 2016-07-05 MED ORDER — MENTHOL 3 MG MT LOZG
1.0000 | LOZENGE | OROMUCOSAL | Status: DC | PRN
  Filled 2016-07-05: qty 9

## 2016-07-05 MED ORDER — OXYCODONE-ACETAMINOPHEN 5-325 MG PO TABS
1.0000 | ORAL_TABLET | ORAL | Status: DC | PRN
Start: 2016-07-05 — End: 2016-07-06
  Administered 2016-07-05 (×4): 1 via ORAL
  Filled 2016-07-05 (×4): qty 1

## 2016-07-05 MED ORDER — ONDANSETRON HCL 4 MG/2ML IJ SOLN
INTRAMUSCULAR | Status: AC
  Filled 2016-07-05: qty 2

## 2016-07-05 MED ORDER — BUPIVACAINE HCL (PF) 0.25 % IJ SOLN
INTRAMUSCULAR | Status: AC
  Filled 2016-07-05: qty 30

## 2016-07-05 SURGICAL SUPPLY — 26 items
CLOTH BEACON ORANGE TIMEOUT ST (SAFETY) ×2 IMPLANT
CONTAINER PREFILL 10% NBF 15ML (MISCELLANEOUS) ×4 IMPLANT
DRSG OPSITE POSTOP 3X4 (GAUZE/BANDAGES/DRESSINGS) ×2 IMPLANT
DURAPREP 26ML APPLICATOR (WOUND CARE) ×2 IMPLANT
ELECT REM PT RETURN 9FT ADLT (ELECTROSURGICAL) ×2
ELECTRODE REM PT RTRN 9FT ADLT (ELECTROSURGICAL) ×1 IMPLANT
GLOVE BIOGEL PI IND STRL 7.0 (GLOVE) ×2 IMPLANT
GLOVE BIOGEL PI INDICATOR 7.0 (GLOVE) ×2
GLOVE ECLIPSE 6.5 STRL STRAW (GLOVE) ×2 IMPLANT
GOWN STRL REUS W/TWL LRG LVL3 (GOWN DISPOSABLE) ×4 IMPLANT
LIQUID BAND (GAUZE/BANDAGES/DRESSINGS) ×2 IMPLANT
NEEDLE HYPO 22GX1.5 SAFETY (NEEDLE) ×2 IMPLANT
NS IRRIG 1000ML POUR BTL (IV SOLUTION) ×2 IMPLANT
PACK ABDOMINAL MINOR (CUSTOM PROCEDURE TRAY) ×2 IMPLANT
PENCIL BUTTON HOLSTER BLD 10FT (ELECTRODE) ×2 IMPLANT
PROTECTOR NERVE ULNAR (MISCELLANEOUS) ×2 IMPLANT
SPONGE LAP 4X18 X RAY DECT (DISPOSABLE) ×2 IMPLANT
SUT CHROMIC GUT AB #0 18 (SUTURE) ×2 IMPLANT
SUT MNCRL AB 4-0 PS2 18 (SUTURE) ×2 IMPLANT
SUT VIC AB 3-0 CT1 27 (SUTURE)
SUT VIC AB 3-0 CT1 TAPERPNT 27 (SUTURE) IMPLANT
SUT VICRYL 0 UR6 27IN ABS (SUTURE) ×2 IMPLANT
SYR CONTROL 10ML LL (SYRINGE) ×2 IMPLANT
TOWEL OR 17X24 6PK STRL BLUE (TOWEL DISPOSABLE) ×4 IMPLANT
TRAY FOLEY CATH SILVER 14FR (SET/KITS/TRAYS/PACK) ×2 IMPLANT
WATER STERILE IRR 1000ML POUR (IV SOLUTION) ×2 IMPLANT

## 2016-07-05 NOTE — Op Note (Signed)
Preop diagnosis: Desire for sterilization  Postop diagnosis: Same  Anesthesia: Epidural  Anesthesiologist: Dr. Michelle Piperssey  Procedure: Postpartum bilateral tubal ligation  Surgeon: Dr. Estanislado Pandyivard  Asst.: None  Estimated blood loss: Minimal  Procedure:  After being informed of the planned procedure with possible complications, including bleeding, infection, injury to other organs, irreversibility of tubal ligation and failure rate of 1 in 500,  informed consent is obtained and patient is taken to OR 3.  She is given epidural anesthesia without any complication with the previously placed epidural catheter. She is then placed in  dorsal decubitus position, prepped and draped in a sterile fashion. A Foley catheter is inserted.  After assessing adequate level of anesthesia, we infiltrate the umbilical area using  10 cc of Marcaine 0.25. We then perform a semi-elliptical incision which was brought down bluntly to the fascia. The fascia is identified and grasped with Coker forceps and incised with Mayo scissors. Peritoneum is entered bluntly. Using Trendelenburg position, moist packing and long retractors, we are able to identify the left tube which was then grasped with  Babcock forceps and exteriorized until the fimbria is visualized. We enter the mesosalpinx with cauterization. We then doubly ligate the proximal stump and the distal stump with 0 chromic. A section of 1.5 cm of isthmo-ampullary  tube is then resected. Both stumps are cauterized and hemostasis is adequate. We proceed in the exact same fashion on the right side again after having  visualized the the fimbriae.  All retractors and sponges are removed. The fascia is closed with a running suture of 0 Vicryl. The skin is closed with a subcuticular suture of 3-0 Monocryl and Dermabond.  Instrument and sponge count was complete x2. Estimated blood loss is minimal. The procedure is well tolerated by the patient who is taken to recovery room in a  well and stable condition.  Specimen: 2 segments of tube sent to pathology.

## 2016-07-05 NOTE — Anesthesia Postprocedure Evaluation (Signed)
Anesthesia Post Note  Patient: Yolanda Johnston  Procedure(s) Performed: Procedure(s) (LRB): POST PARTUM TUBAL LIGATION (N/A)  Patient location during evaluation: Mother Baby Anesthesia Type: Epidural Level of consciousness: awake and awake and alert Pain management: satisfactory to patient Vital Signs Assessment: post-procedure vital signs reviewed and stable Respiratory status: spontaneous breathing Cardiovascular status: blood pressure returned to baseline and stable Postop Assessment: no headache, no backache, epidural receding, no signs of nausea or vomiting, adequate PO intake and patient able to bend at knees Anesthetic complications: no    Last Vitals:  Vitals:   07/05/16 0755 07/05/16 1245  BP: 120/77 126/69  Pulse: 85 77  Resp: 18 18  Temp: 36.9 C 36.7 C    Last Pain:  Vitals:   07/05/16 0755  TempSrc: Oral  PainSc: 0-No pain                 Pernie Grosso R

## 2016-07-05 NOTE — Progress Notes (Signed)
Pt reports pain improved with 2 percocet tablets but began to increase quickly after about 2 hours post medication.  She reports it is worse because she is coughing a lot and would like something to help with the cough.  Dr. Estanislado Pandyivard notified and ordered the pt to be given a nebulizer treatment and cepacol lozenges.  Pt also feels like she needs to have a bowel movement but is unable to and would like something to help with her bowel movements and quickly.  Dr. Estanislado Pandyivard notified and ordered a fleet enema and colace two 100mg  capsules daily.  Will continue to monitor pt pain and bowel movements.

## 2016-07-05 NOTE — Anesthesia Preprocedure Evaluation (Signed)
Anesthesia Evaluation  Patient identified by MRN, date of birth, ID band Patient awake    Reviewed: Allergy & Precautions, NPO status , Patient's Chart, lab work & pertinent test results  Airway Mallampati: I  TM Distance: >3 FB Neck ROM: Full    Dental   Pulmonary asthma , former smoker,    Pulmonary exam normal        Cardiovascular Normal cardiovascular exam     Neuro/Psych    GI/Hepatic   Endo/Other    Renal/GU      Musculoskeletal   Abdominal   Peds  Hematology   Anesthesia Other Findings   Reproductive/Obstetrics                             Anesthesia Physical Anesthesia Plan  ASA: II  Anesthesia Plan: Epidural   Post-op Pain Management:    Induction: Intravenous  Airway Management Planned: Simple Face Mask  Additional Equipment:   Intra-op Plan:   Post-operative Plan: Extubation in OR  Informed Consent: I have reviewed the patients History and Physical, chart, labs and discussed the procedure including the risks, benefits and alternatives for the proposed anesthesia with the patient or authorized representative who has indicated his/her understanding and acceptance.     Plan Discussed with: CRNA and Surgeon  Anesthesia Plan Comments:         Anesthesia Quick Evaluation

## 2016-07-05 NOTE — Progress Notes (Signed)
Post Partum Day 1 Subjective:  Well. Lochia are normal. Voiding, ambulating, tolerating normal diet. nursing going well.  Objective: Blood pressure 120/77, pulse 85, temperature 98.4 F (36.9 C), temperature source Oral, resp. rate 18, height 5\' 7"  (1.702 m), weight 249 lb (112.9 kg), SpO2 100 %, unknown if currently breastfeeding.  Physical Exam:  General: normal Lochia: appropriate Uterine Fundus: 0/2 firm non-tender  Extremities: No evidence of DVT seen on physical exam. Edema minimal     Recent Labs  07/04/16 1140 07/05/16 0546  HGB 11.9* 10.8*  HCT 35.2* 32.4*    Assessment/Plan: Normal Post-partum. Continue routine post-partum care. PP BTL today  Tubal ligation reviewed with the patient  Procedure reviewed with possible need for general anesthesia. Informed of the failure rate of 1/500 and irreversibility. Risks of operative complications reviewed with possible anesthetic complication, infection, bleeding and injury to intra-abdominal organ. Patient voices understanding and is agreeable to proceed.  Anticipate discharge tomorrow    LOS: 1 day   Ottis Vacha A MD 07/05/2016, 10:09 AM

## 2016-07-05 NOTE — Anesthesia Postprocedure Evaluation (Signed)
Anesthesia Post Note  Patient: Yolanda Johnston  Procedure(s) Performed: Procedure(s) (LRB): POST PARTUM TUBAL LIGATION (N/A)  Patient location during evaluation: PACU Anesthesia Type: Epidural Level of consciousness: awake Pain management: pain level controlled Vital Signs Assessment: post-procedure vital signs reviewed and stable Cardiovascular status: stable Postop Assessment: no headache, no backache, epidural receding, no signs of nausea or vomiting and patient able to bend at knees Anesthetic complications: no     Last Vitals:  Vitals:   07/05/16 1315 07/05/16 1330  BP: 115/64 103/74  Pulse: 72 89  Resp: 16 20  Temp:      Last Pain:  Vitals:   07/05/16 0755  TempSrc: Oral  PainSc: 0-No pain   Pain Goal:                 [name],[name] Natalynn Pedone

## 2016-07-05 NOTE — Transfer of Care (Signed)
Immediate Anesthesia Transfer of Care Note  Patient: Yolanda Johnston  Procedure(s) Performed: Procedure(s): POST PARTUM TUBAL LIGATION (N/A)  Patient Location: PACU  Anesthesia Type:Epidural  Level of Consciousness: awake, alert  and oriented  Airway & Oxygen Therapy: Patient Spontanous Breathing  Post-op Assessment: Report given to RN and Post -op Vital signs reviewed and stable  Post vital signs: Reviewed and stable  Last Vitals:  Vitals:   07/05/16 0559 07/05/16 0755  BP: 116/60 120/77  Pulse: 81 85  Resp: 18 18  Temp: 36.9 C 36.9 C    Last Pain:  Vitals:   07/05/16 0755  TempSrc: Oral  PainSc: 0-No pain         Complications: No apparent anesthesia complications

## 2016-07-06 MED ORDER — IBUPROFEN 600 MG PO TABS: 600.0000 mg | ORAL_TABLET | Freq: Four times a day (QID) | ORAL | 0 refills | Status: AC

## 2016-07-06 MED ORDER — OXYCODONE-ACETAMINOPHEN 5-325 MG PO TABS: 1.0000 | ORAL_TABLET | ORAL | 0 refills | Status: DC | PRN

## 2016-07-06 NOTE — Discharge Summary (Addendum)
Vaginal Delivery Discharge Summary  Yolanda Johnston  DOB:    June 07, 1986 MRN:    [id number] CSN:    [id number]  Date of admission:                  07/04/2016  Date of discharge:                   07/06/2016  Procedures this admission:   1.  Vaginal delivery.  2.  Postpartum bilateral tubal ligation.   Date of Delivery: 07/04/2016  Newborn Data:  Live born female  Birth Weight: 7 lb 5.3 oz (3325 g) APGAR: 9, 9  Home with mother. Circumcision Plan: Outpatient  History of Present Illness:  Yolanda Johnston is a 30 y.o. female, [id number], who presents at 6069w4d weeks gestation. [facility]. She was admitted for onset of labor. Her pregnancy has been complicated by:  Patient Active Problem List   Diagnosis Date Noted  . Indication for care or intervention related to labor and delivery 07/04/2016     Hospital Course:   Admitted, GBS neg.   Progressed well.  Utilized epidural for pain management.  Delivery was performed by Dr. Jaymes GraffNaima Dillard without complication. Patient and baby tolerated the procedure without difficulty, with  No laceration noted. Infant status was stable and remained in room with mother.  Mother and infant then had an uncomplicated postpartum course, with bottlefeeding going well. Mom's physical exam was WNL, and she was discharged home in stable condition. Contraception plan was bilateral tubal ligation performed on postpartum day 1 performed by Dr. Estanislado Pandyivard  She received adequate benefit from po pain medications.   Intrapartum Procedures: Vaginal delivery at term.   Postpartum Procedures: P.P. tubal ligation Complications-Operative and Postpartum: none  Discharge Diagnoses: Term Pregnancy-delivered  Hemoglobin Results:  CBC Latest Ref Rng & Units 07/05/2016 07/04/2016 06/12/2016  WBC 4.0 - 10.5 K/uL 11.2(H) 9.1 9.2  Hemoglobin 12.0 - 15.0 g/dL 10.8(L)  11.9(L) 10.3(L)  Hematocrit 36.0 - 46.0 % 32.4(L) 35.2(L) 31.1(L)  Platelets 150 - 400 K/uL 250 319 248    Prenatal labs: ABO, Rh: --/--/A POS (10/09 1140) Antibody: NEG (10/09 1140) Rubella: Immune (04/19 0000) RPR: Nonreactive (04/19 0000)  HBsAg: Negative (04/19 0000)  HIV: Non-reactive (04/19 0000)  GBS: Negative (09/20 0000)    Discharge Physical Exam:   General: alert, cooperative and no distress Lochia: appropriate Uterine Fundus: firm Incision: no significant drainage DVT Evaluation: No evidence of DVT seen on physical exam.   Discharge Information:  Activity:           pelvic rest and avoid heavy lifting Diet:                routine Medications: Ibuprofen and Percocet Condition:      stable Instructions:  Routine pp instructions   Discharge to: home  Follow up:  6 weeks for postpartum check.  Outpatient circumcision for baby.     Konrad FelixKULWA,Kathryn Cosby WAKURU MD 07/06/2016 9:52 AM

## 2016-07-06 NOTE — Anesthesia Postprocedure Evaluation (Signed)
Anesthesia Post Note  Patient: Yolanda Johnston  Procedure(s) Performed: * No procedures listed *  Anesthesia Type: Epidural Vital Signs Assessment: post-procedure vital signs reviewed and stable Anesthetic complications: no    Last Vitals: There were no vitals filed for this visit.  Last Pain: There were no vitals filed for this visit.               Jakell Trusty JENNETTE

## 2016-07-07 ENCOUNTER — Encounter (HOSPITAL_COMMUNITY): Payer: Self-pay | Admitting: Obstetrics and Gynecology

## 2016-11-16 IMAGING — US US MFM OB DETAIL+14 WK
1 series · 13 of 28 positions shown · non-contrast
Comparison: none

[Series 1: us mfm ob detail+14 wk · 102 acquisitions, 13 frames shown]
[im 4/102]
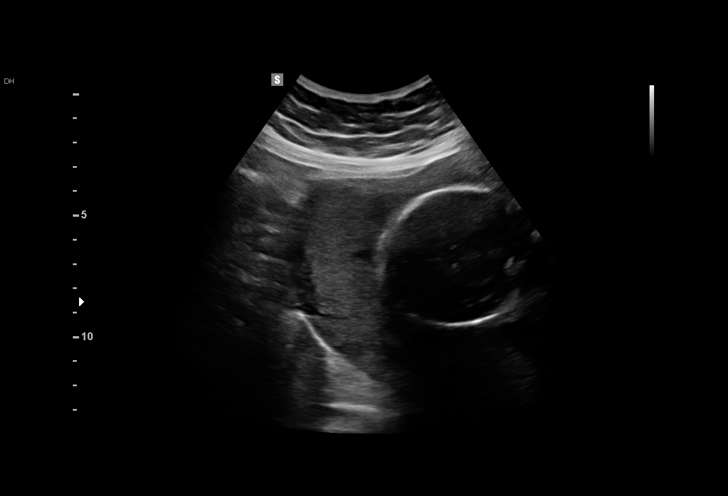
[im 12/102]
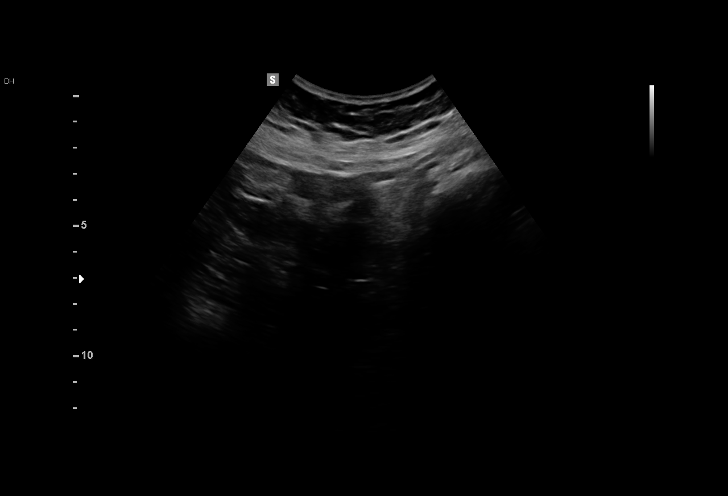
[im 19/102]
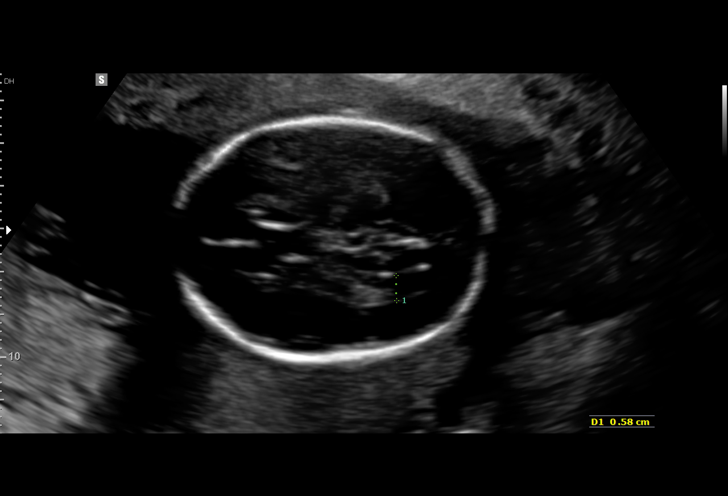
[im 27/102]
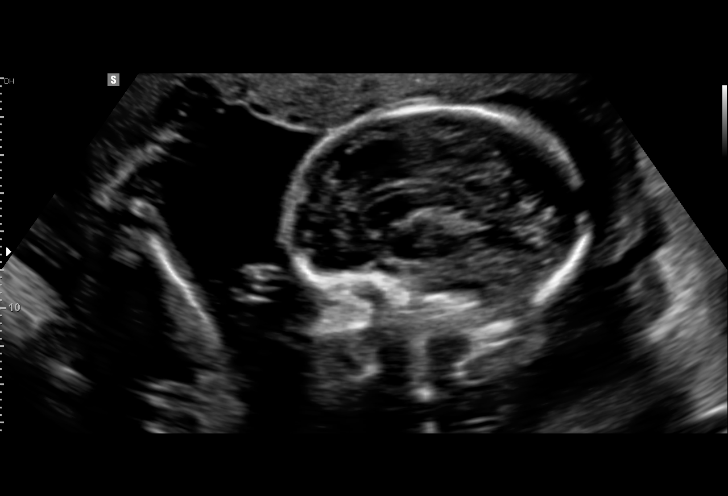
[im 34/102]
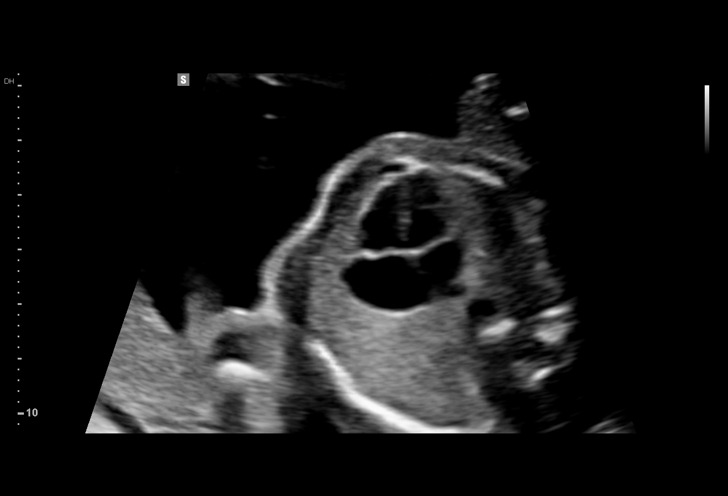
[im 42/102]
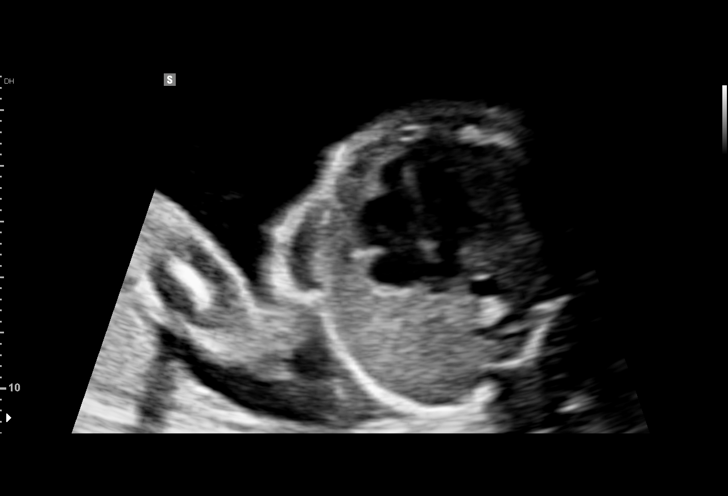
[im 53/102]
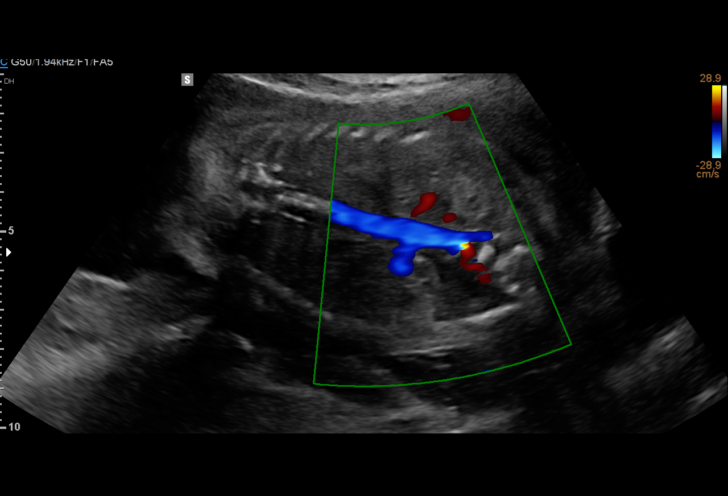
[im 60/102]
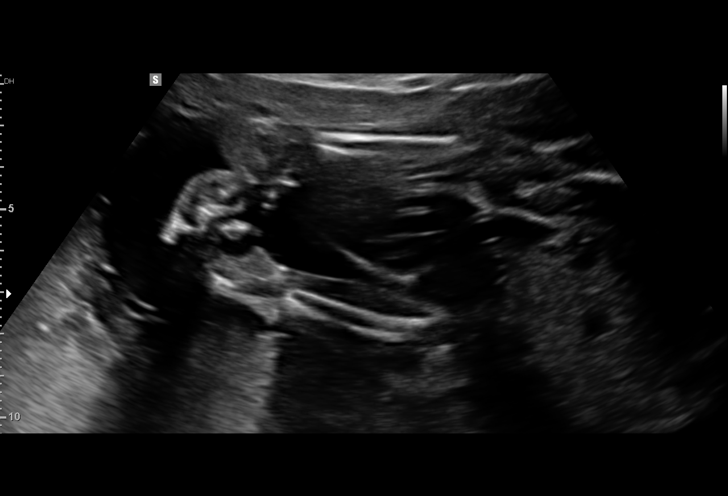
[im 68/102]
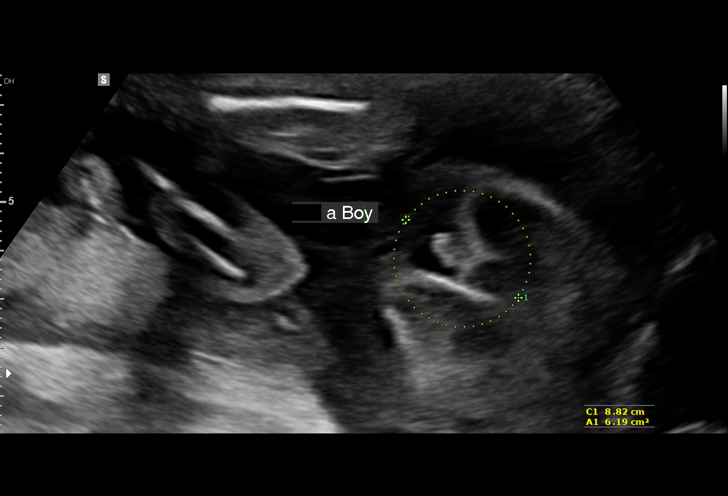
[im 75/102]
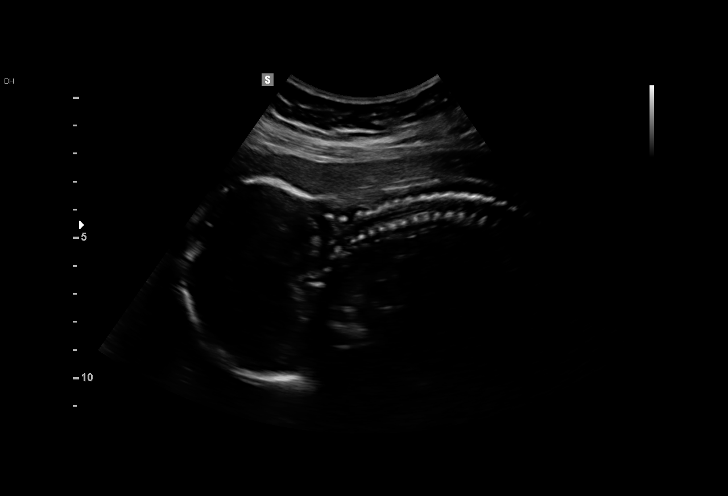
[im 83/102]
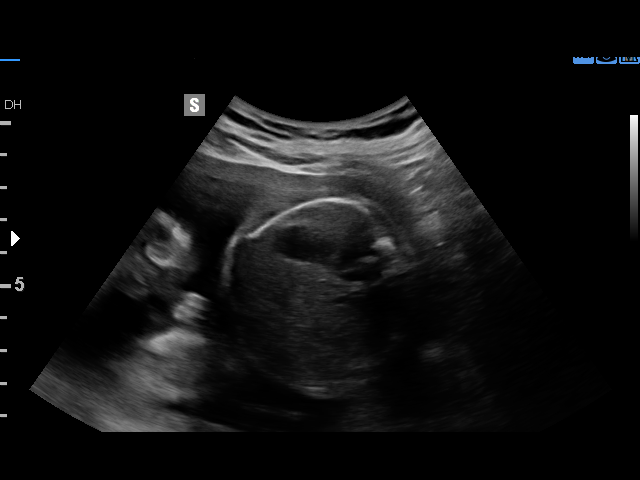
[im 90/102]
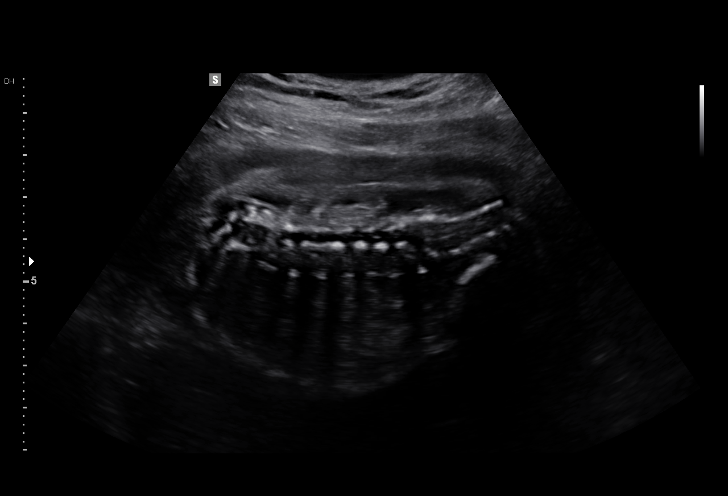
[im 98/102]
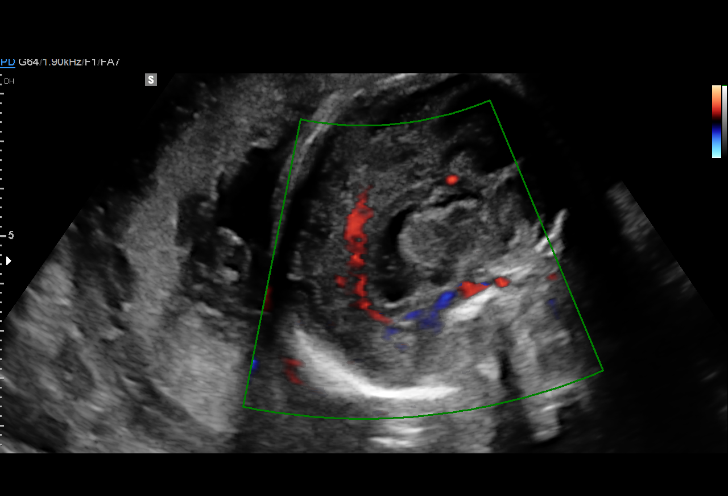

[13 of 28 positions shown; findings below may reference images not displayed]

Obstetrics &
Gynecology
[address]
[place]

1  KNUT EVEN SVANEVIK             [id number]      [id number]     [id number]
Indications

23 weeks gestation of pregnancy
Detailed fetal anatomic survey                 Z36
Fetal abnormality - other known or
suspected (lagging BPD, abnormal CSP,
marginal cord insertion)
Obesity complicating pregnancy, second
trimester
Late prenatal care, second trimester
OB History

Blood Type:   A+       Height:         Weight (lb):            BMI:
Gravidity:    5         Term:   3        Prem:   0         SAB:   0
TOP:          1       Ectopic:  0        Living: 3
Fetal Evaluation

Num Of Fetuses:     1
Fetal Heart         164
Rate(bpm):
Cardiac Activity:   Observed
Presentation:       Breech
Placenta:           Posterior, above cervical os
P. Cord Insertion:  Marginal ins at right fundus

Amniotic Fluid
AFI FV:      Subjectively within normal limits
Largest Pocket(cm)
5.6
Biometry

BPD:      55.1  mm     G. Age:  22w 6d         14  %    CI:         67.91  %    70 - 86
FL/HC:       18.1  %    18.7 -
HC:      213.9  mm     G. Age:  23w 3d         24  %    HC/AC:       1.04       1.05 -
AC:      205.5  mm     G. Age:  25w 1d         82  %    FL/BPD:      70.4  %    71 - 87
FL:       38.8  mm     G. Age:  22w 3d          8  %    FL/AC:       18.9  %    20 - 24
HUM:      36.1  mm     G. Age:  22w 4d         15  %
CER:      25.3  mm     G. Age:  23w 2d         42  %
CM:          5  mm

Est. FW:     637   gm     1 lb 6 oz     55  %
Gestational Age

LMP:           25w 0d        Date:  09/28/15                 EDD:    07/04/16
U/S Today:     23w 3d                                        EDD:    07/15/16
Best:          23w 5d     Det. By:  Early Ultrasound         EDD:    07/13/16
(01/13/16)
Anatomy

Cranium:               Appears normal         Aortic Arch:            Appears normal
Cavum:                 Appears normal         Ductal Arch:            Appears normal
Ventricles:            Appears normal         Diaphragm:              Appears normal
Choroid Plexus:        Appears normal         Stomach:                Appears normal, left
sided
Cerebellum:            Appears normal         Abdomen:                Appears normal
Posterior Fossa:       Appears normal         Abdominal Wall:         Appears nml (cord
insert, abd wall)
Nuchal Fold:           Not applicable (>20    Cord Vessels:           Appears normal (3
wks GA)                                        vessel cord)
Face:                  Appears normal         Kidneys:                Appear normal
(orbits and profile)
Lips:                  Appears normal         Bladder:                Appears normal
Thoracic:              Appears normal         Spine:                  Appears normal
Heart:                 Appears normal         Upper Extremities:      Appears normal
(4CH, axis, and
situs)
RVOT:                  Appears normal         Lower Extremities:      Appears normal
LVOT:                  Appears normal

Other:  Fetus appears to be a male. Heels and 5th digit visualized. Nasal
bone visualized. Technically difficult due to fetal position.
Cervix Uterus Adnexa

Cervix
Length:            4.2  cm.
Not visualized (advanced GA >98wks)

Adnexa:       No abnormality visualized.
Impression

SIUP at 23+5 weeks
Normal detailed fetal anatomy; normal CSP with cavum
vergae (normal variant)
Markers of aneuploidy: none
Normal amniotic fluid volume
Measurements consistent with early US; EFW at the 55th
%tile
Recommendations

Follow-up as clinically indicated

## 2017-02-07 IMAGING — US US ABDOMEN LIMITED
1 series · 15 of 25 positions shown · non-contrast
Comparison: None.

CLINICAL DATA: Acute onset of right upper quadrant abdominal pain.
Initial encounter.

EXAM:
US ABDOMEN LIMITED - RIGHT UPPER QUADRANT

[Series 1: us abdomen limited · 15 of 35 slices shown]
[im 1/35]
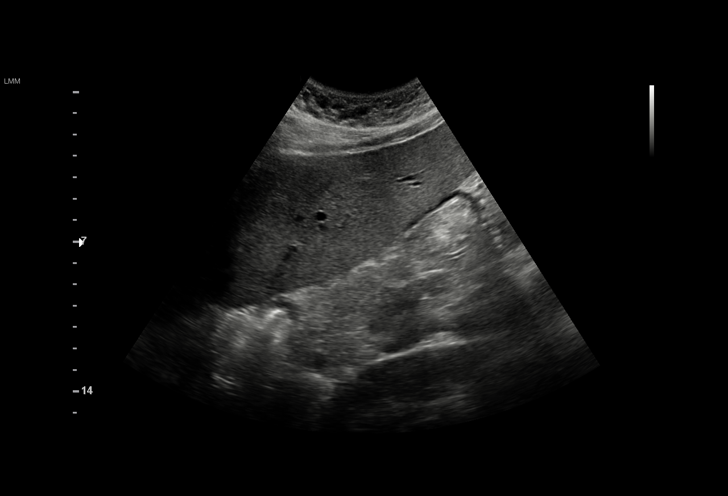
[im 3/35]
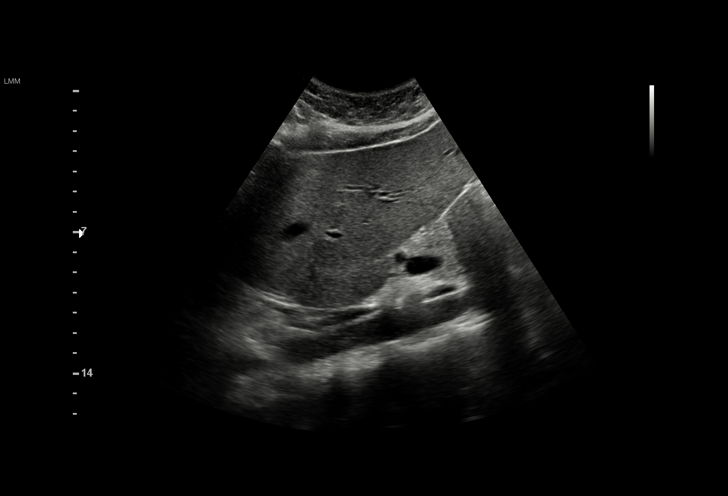
[im 6/35]
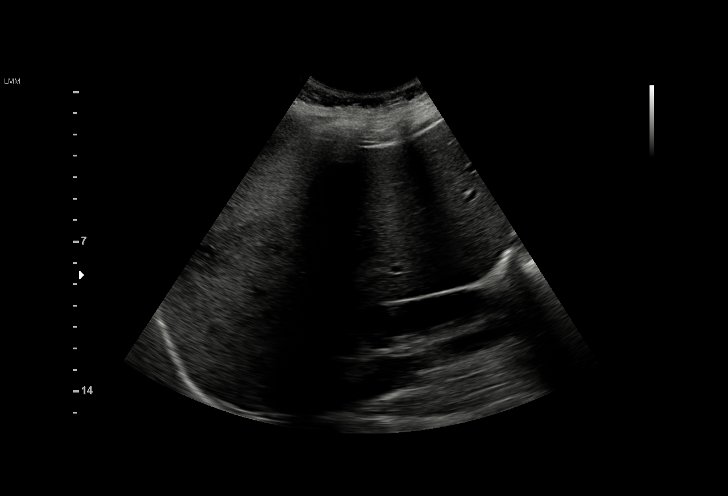
[im 8/35]
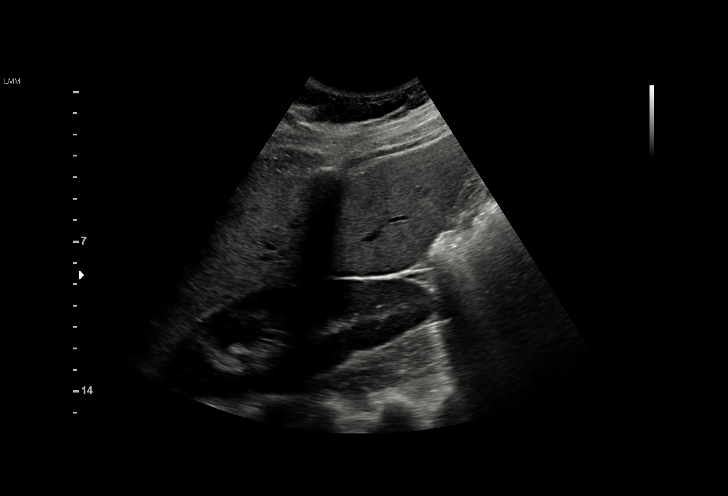
[im 10/35]
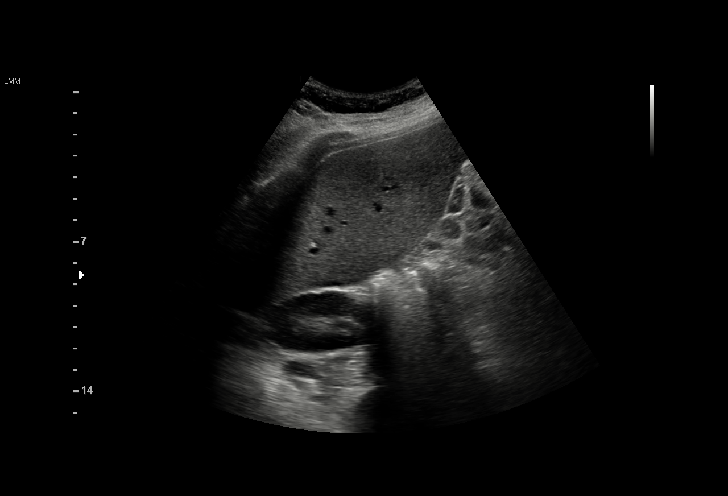
[im 13/35]
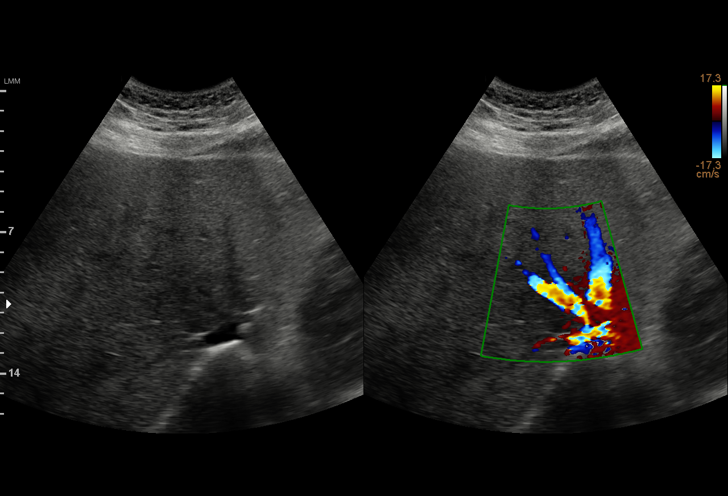
[im 15/35]
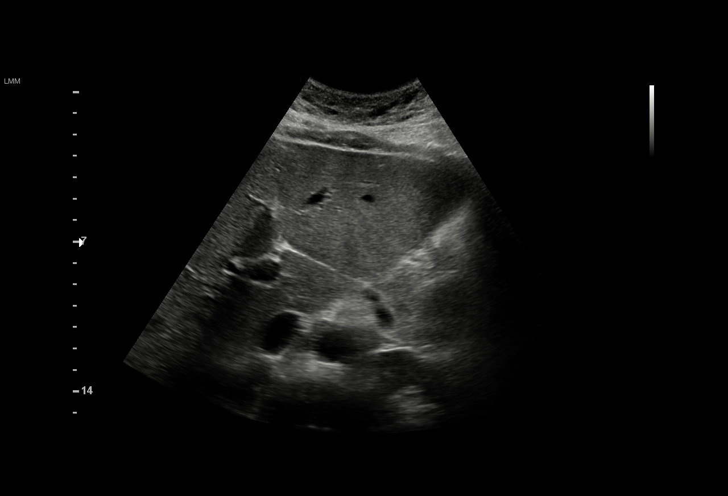
[im 18/35]
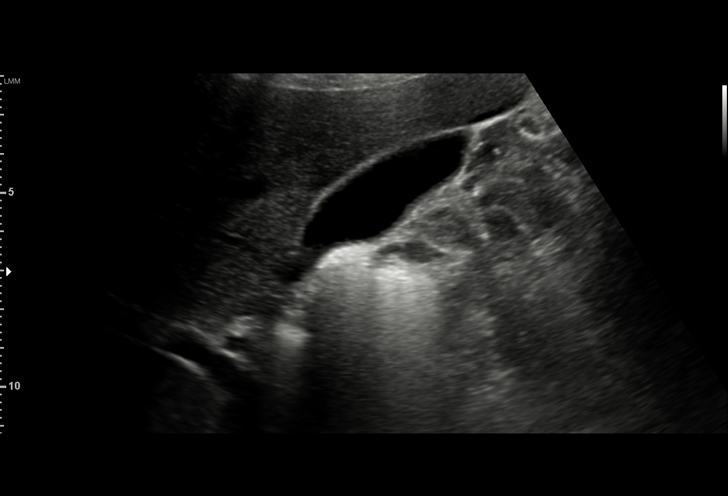
[im 20/35]
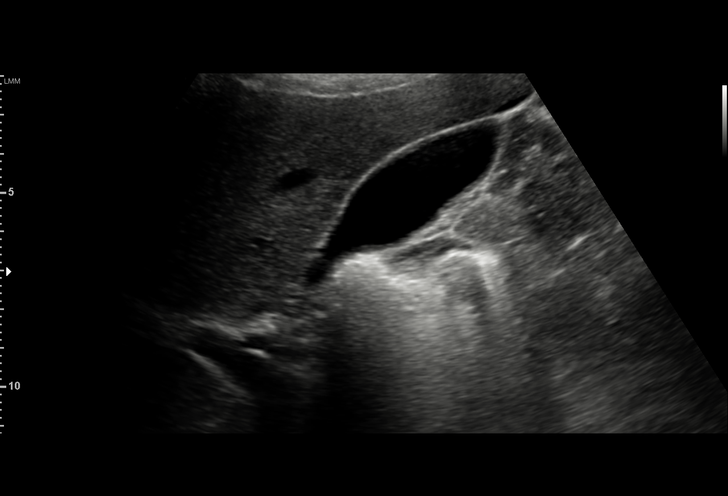
[im 22/35]
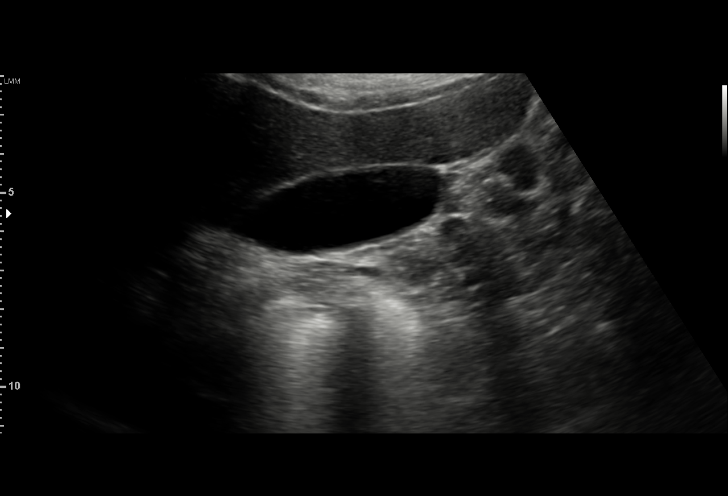
[im 25/35]
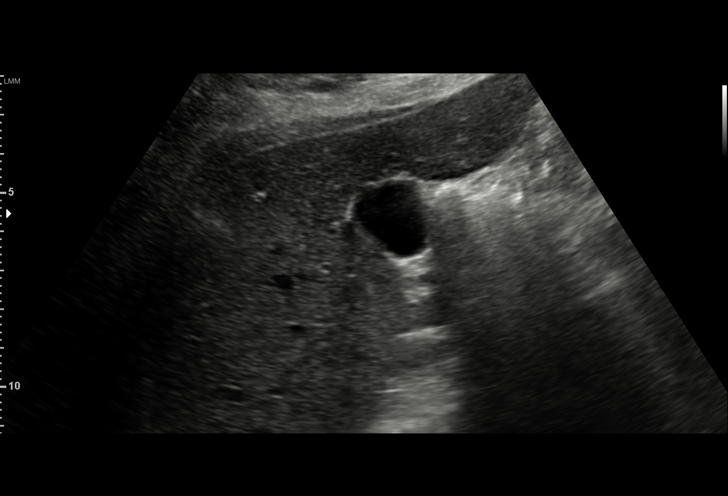
[im 27/35]
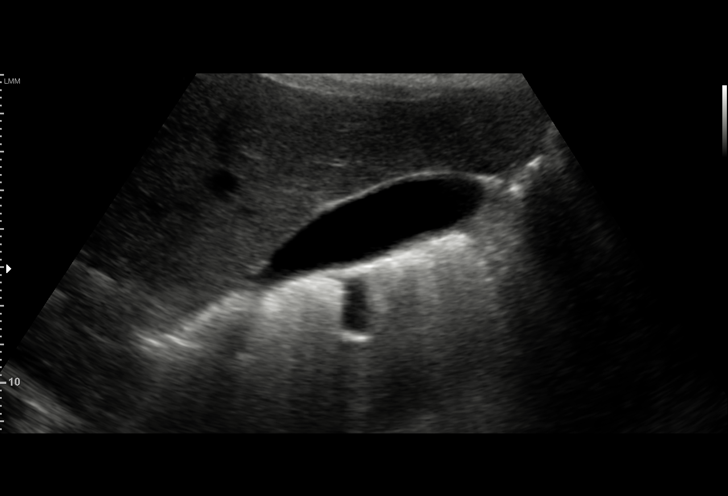
[im 29/35]
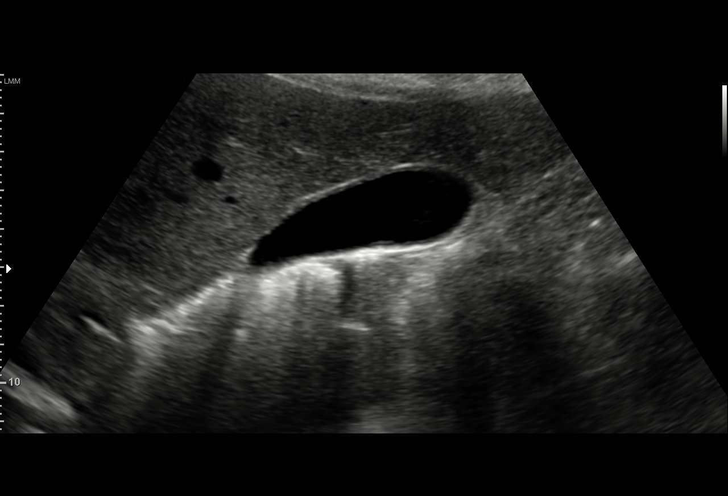
[im 32/35]
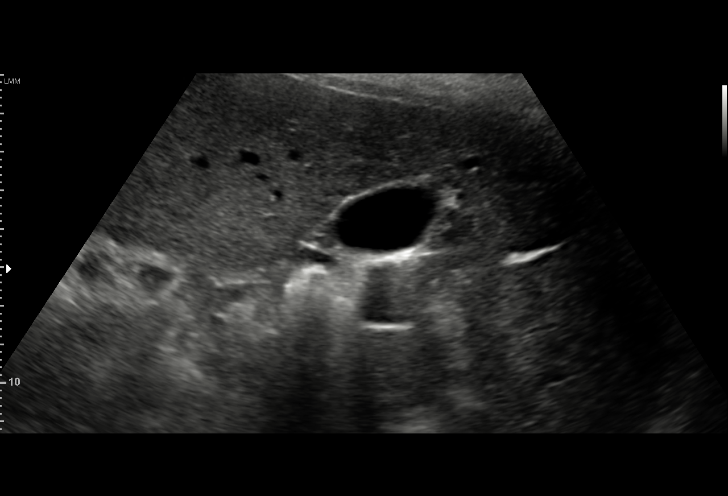
[im 35/35]
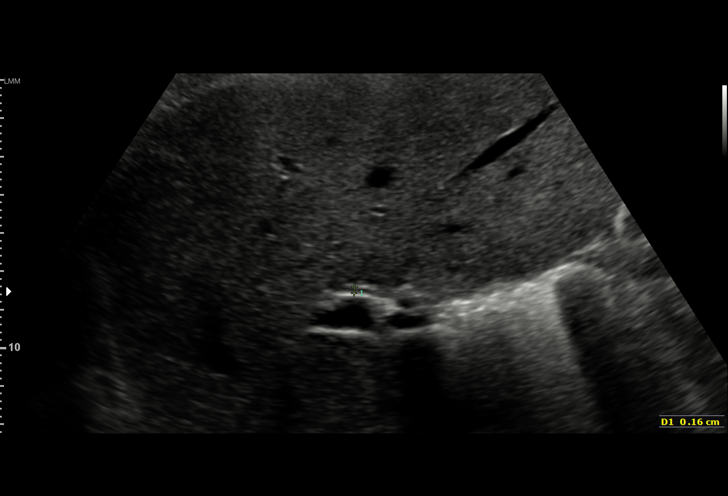

[15 of 25 positions shown; findings below may reference images not displayed]

FINDINGS: Gallbladder:

No gallstones or wall thickening visualized. No sonographic Murphy
sign noted by sonographer.

Common bile duct:

Diameter: 0.2 cm, within normal limits in caliber.

Liver:

No focal lesion identified. Within normal limits in parenchymal
echogenicity.
IMPRESSION: Unremarkable ultrasound of the right upper quadrant.

## 2017-12-19 ENCOUNTER — Encounter: Payer: Self-pay | Admitting: Podiatry

## 2017-12-24 NOTE — Progress Notes (Signed)
Encounter created in error. Disregard.

## 2018-01-08 ENCOUNTER — Encounter: Payer: Medicaid Other | Admitting: Podiatry

## 2018-01-22 NOTE — Progress Notes (Signed)
Erroneous

## 2022-04-26 ENCOUNTER — Encounter: Payer: Self-pay | Admitting: General Practice

## 2022-06-30 ENCOUNTER — Encounter: Payer: Medicaid Other | Admitting: Family Medicine

## 2022-07-04 ENCOUNTER — Encounter: Payer: Self-pay | Admitting: General Practice

## 2022-07-06 ENCOUNTER — Encounter: Payer: Self-pay | Admitting: General Practice

## 2023-09-06 ENCOUNTER — Encounter (HOSPITAL_BASED_OUTPATIENT_CLINIC_OR_DEPARTMENT_OTHER): Payer: Self-pay

## 2023-09-06 ENCOUNTER — Other Ambulatory Visit (HOSPITAL_BASED_OUTPATIENT_CLINIC_OR_DEPARTMENT_OTHER): Payer: Self-pay

## 2023-09-06 ENCOUNTER — Ambulatory Visit (HOSPITAL_BASED_OUTPATIENT_CLINIC_OR_DEPARTMENT_OTHER)
Admission: EM | Admit: 2023-09-06 | Discharge: 2023-09-06 | Disposition: A | Discharge status: Home / Self Care | Payer: Medicaid Other | Attending: Internal Medicine | Admitting: Internal Medicine

## 2023-09-06 DIAGNOSIS — N898 Other specified noninflammatory disorders of vagina: Secondary | ICD-10-CM | POA: Insufficient documentation

## 2023-09-06 DIAGNOSIS — R300 Dysuria: Secondary | ICD-10-CM | POA: Insufficient documentation

## 2023-09-06 LAB — POCT URINALYSIS DIP (MANUAL ENTRY)
Bilirubin, UA: NEGATIVE
Glucose, UA: NEGATIVE mg/dL
Ketones, POC UA: NEGATIVE mg/dL
Leukocytes, UA: NEGATIVE
Nitrite, UA: NEGATIVE
Spec Grav, UA: 1.025 (ref 1.010–1.025)
Urobilinogen, UA: 1 U/dL
pH, UA: 7 (ref 5.0–8.0)

## 2023-09-06 MED ORDER — CEPHALEXIN 500 MG PO CAPS
500.0000 mg | ORAL_CAPSULE | Freq: Two times a day (BID) | ORAL | 0 refills | Status: AC
  Filled 2023-09-06: qty 14, 7d supply, fill #0

## 2023-09-06 MED ORDER — CEPHALEXIN 500 MG PO CAPS: 500.0000 mg | ORAL_CAPSULE | Freq: Two times a day (BID) | ORAL | 0 refills | Status: DC

## 2023-09-06 NOTE — ED Provider Notes (Signed)
[facility]    CSN: [id number] Arrival date & time: 09/06/23  0858      History   Chief Complaint Chief Complaint  Patient presents with   Dysuria    HPI Yolanda Johnston is a 37 y.o. female.   Yolanda Johnston is a 37 y.o. female presenting for chief complaint of Dysuria, urinary urgency, and dark/cloudy urine that started 3 days ago. Specifically states it does not "burn" with urination, rather feels very uncomfortable and as if she cannot fully empty her bladder. Reports dark and cloudy urine, states she drinks plenty of water but did start drinking coffee daily from [employer] 1 month ago. Denies urinary frequency, N/V/D, flank pain, dizziness, and fevers/chills. Reports bilateral lower abdominal discomfort and bilateral lower back discomfort that started 3 days ago as well. Additionally reports some vaginal discomfort with slight white vaginal discharge, no vaginal odor or itch. She does not take an SGLT2 inhibitior and denies history of diabetes. No recent antibiotic use. She attempted to do an e-visit yesterday for symptoms but did not realize that the provider had sent in medications for UTI and has not taken any of the medication (macrobid/diflucan).  No attempted use of any OTC medications to help with symptoms PTA.  History of uterine ablation, denies chance of pregnancy, no concerns for STD.    Dysuria   Past Medical History:  Diagnosis Date   Asthma     Patient Active Problem List   Diagnosis Date Noted   Indication for care or intervention related to labor and delivery 07/04/2016    Past Surgical History:  Procedure Laterality Date   NO PAST SURGERIES     TUBAL LIGATION N/A 07/05/2016   Procedure: POST PARTUM TUBAL LIGATION;  Surgeon: Silverio Lay, MD;  Location: [facility];  Service: Gynecology;  Laterality: N/A;    OB History     Gravida  5   Para  4   Term  4   Preterm      AB  1   Living  4      SAB      IAB  1   Ectopic       Multiple  0   Live Births  1            Home Medications    Prior to Admission medications   Medication Sig Start Date End Date Taking? Authorizing Provider  albuterol (PROVENTIL HFA;VENTOLIN HFA) 108 (90 Base) MCG/ACT inhaler Inhale 2 puffs into the lungs every 6 (six) hours as needed for wheezing or shortness of breath.    [provider]  calcium carbonate (TUMS - DOSED IN MG ELEMENTAL CALCIUM) 500 MG chewable tablet Chew 2 tablets by mouth as needed for indigestion or heartburn.     [provider]  cephALEXin (KEFLEX) 500 MG capsule Take 1 capsule (500 mg total) by mouth 2 (two) times daily for 7 days. 09/06/23 09/13/23  Carlisle Beers, FNP  ibuprofen (ADVIL,MOTRIN) 600 MG tablet Take 1 tablet (600 mg total) by mouth every 6 (six) hours. 07/06/16   Hoover Browns, MD  oxyCODONE-acetaminophen (PERCOCET/ROXICET) 5-325 MG tablet Take 1 tablet by mouth every 4 (four) hours as needed for moderate pain. 07/06/16   Hoover Browns, MD  Prenat-FeAsp-Meth-FA-DHA w/o A (PRENATE PIXIE) 10-0.6-0.4-200 MG CAPS Take 1 capsule by mouth daily.    [provider]    Family History History reviewed. No pertinent family history.  Social History Social History   Tobacco Use  Smoking status: Former    Current packs/day: 0.00    Types: Cigarettes    Quit date: 03/26/2016    Years since quitting: 7.4   Smokeless tobacco: Never  Substance Use Topics   Alcohol use: No   Drug use: No     Allergies   Patient has no known allergies.   Review of Systems Review of Systems  Genitourinary:  Positive for dysuria.  Per HPI   Physical Exam Triage Vital Signs ED Triage Vitals  Encounter Vitals Group     BP 09/06/23 0927 114/78     Systolic BP Percentile --      Diastolic BP Percentile --      Pulse Rate 09/06/23 0927 (!) 106     Resp 09/06/23 0927 20     Temp 09/06/23 0927 98.2 F (36.8 C)     Temp src --      SpO2 09/06/23 0927 99 %     Weight 09/06/23 0928  188 lb (85.3 kg)     Height --      Head Circumference --      Peak Flow --      Pain Score 09/06/23 0927 4     Pain Loc --      Pain Education --      Exclude from Growth Chart --    No data found.  Updated Vital Signs BP 114/78 (BP Location: Right Arm)   Pulse (!) 106   Temp 98.2 F (36.8 C)   Resp 20   Wt 188 lb (85.3 kg)   SpO2 99%   BMI 29.44 kg/m   Visual Acuity Right Eye Distance:   Left Eye Distance:   Bilateral Distance:    Right Eye Near:   Left Eye Near:    Bilateral Near:     Physical Exam Vitals and nursing note reviewed.  Constitutional:      Appearance: She is not ill-appearing or toxic-appearing.  HENT:     Head: Normocephalic and atraumatic.     Right Ear: Hearing and external ear normal.     Left Ear: Hearing and external ear normal.     Nose: Nose normal.     Mouth/Throat:     Lips: Pink.     Mouth: Mucous membranes are moist. No injury.     Tongue: No lesions. Tongue does not deviate from midline.     Palate: No mass and lesions.     Pharynx: Oropharynx is clear. Uvula midline. No pharyngeal swelling, oropharyngeal exudate, posterior oropharyngeal erythema or uvula swelling.     Tonsils: No tonsillar exudate or tonsillar abscesses.  Eyes:     General: Lids are normal. Vision grossly intact. Gaze aligned appropriately.     Extraocular Movements: Extraocular movements intact.     Conjunctiva/sclera: Conjunctivae normal.  Pulmonary:     Effort: Pulmonary effort is normal.  Abdominal:     General: Bowel sounds are normal.     Palpations: Abdomen is soft.     Tenderness: There is no abdominal tenderness. There is no right CVA tenderness, left CVA tenderness or guarding.  Musculoskeletal:     Cervical back: Neck supple.  Skin:    General: Skin is warm and dry.     Capillary Refill: Capillary refill takes less than 2 seconds.     Findings: No rash.  Neurological:     General: No focal deficit present.     Mental Status: She is alert and  oriented to person, place,  and time. Mental status is at baseline.     Cranial Nerves: No dysarthria or facial asymmetry.  Psychiatric:        Mood and Affect: Mood normal.        Speech: Speech normal.        Behavior: Behavior normal.        Thought Content: Thought content normal.        Judgment: Judgment normal.      UC Treatments / Results  Labs (all labs ordered are listed, but only abnormal results are displayed) Labs Reviewed  POCT URINALYSIS DIP (MANUAL ENTRY) - Abnormal; Notable for the following components:      Result Value   Clarity, UA hazy (*)    Blood, UA moderate (*)    Protein Ur, POC trace (*)    All other components within normal limits  URINE CULTURE  CERVICOVAGINAL ANCILLARY ONLY    EKG   Radiology No results found.  Procedures Procedures (including critical care time)  Medications Ordered in UC Medications - No data to display  Initial Impression / Assessment and Plan / UC Course  I have reviewed the triage vital signs and the nursing notes.  Pertinent labs & imaging results that were available during my care of the patient were reviewed by me and considered in my medical decision making (see chart for details).   1. Dysuria, vaginal discharge Urinalysis shows hazy urine with moderate blood, no leukocytes. I'd like to culture the urine and go ahead and treat for probable acute cystitis with Keflex BID for 7 days. Advised to continue drinking plenty of fluids and avoid urinary irritants.  No systemic symptoms or signs of pyelonephritis/nephrolithiasis.   Vaginal swab pending to investigate for BV/yeast. No concern for STD.  Counseled patient on potential for adverse effects with medications prescribed/recommended today, strict ER and return-to-clinic precautions discussed, patient verbalized understanding.    Final Clinical Impressions(s) / UC Diagnoses   Final diagnoses:  Dysuria  Vaginal discharge     Discharge Instructions       Take keflex antibiotic every 12 hours for the next 7 days to treat potential UTI.  I have sent your urine for  culture and will call you if we need to change your antibiotic based on the urine culture results.   Drink plenty of water (at least 64 ounces per day) and decrease the amount of caffeine you are drinking.   Follow-up with PCP as needed.  If you develop fevers, chills, worsening abdominal pain, flank pain, dizziness, headaches, or any new or worsening symptoms, return to urgent care. Go to ER for severe symptoms.      ED Prescriptions     Medication Sig Dispense Auth. Provider   cephALEXin (KEFLEX) 500 MG capsule  (Status: Discontinued) Take 1 capsule (500 mg total) by mouth 2 (two) times daily for 7 days. 14 capsule Reita May M, FNP   cephALEXin (KEFLEX) 500 MG capsule Take 1 capsule (500 mg total) by mouth 2 (two) times daily for 7 days. 14 capsule Carlisle Beers, FNP      PDMP not reviewed this encounter.   Carlisle Beers, [place] 09/06/23 1023

## 2023-09-06 NOTE — Discharge Instructions (Addendum)
Take keflex antibiotic every 12 hours for the next 7 days to treat potential UTI.  I have sent your urine for  culture and will call you if we need to change your antibiotic based on the urine culture results.   Drink plenty of water (at least 64 ounces per day) and decrease the amount of caffeine you are drinking.   Follow-up with PCP as needed.  If you develop fevers, chills, worsening abdominal pain, flank pain, dizziness, headaches, or any new or worsening symptoms, return to urgent care. Go to ER for severe symptoms.

## 2023-09-06 NOTE — ED Triage Notes (Signed)
Dysuria x 3 days.. Low back pain with low abd pain. Reports strong odor to urine as well. Denies discharge.

## 2023-09-07 LAB — URINE CULTURE: Culture: <10000 — AB

## 2023-09-07 LAB — CERVICOVAGINAL ANCILLARY ONLY
Bacterial Vaginitis (gardnerella): NEGATIVE
Candida Glabrata: NEGATIVE
Candida Vaginitis: NEGATIVE
Comment: NEGATIVE
Comment: NEGATIVE
Comment: NEGATIVE

## 2023-09-14 ENCOUNTER — Telehealth (HOSPITAL_BASED_OUTPATIENT_CLINIC_OR_DEPARTMENT_OTHER): Payer: Self-pay | Admitting: Emergency Medicine

## 2023-09-14 NOTE — Telephone Encounter (Signed)
Pt called having questions about new symptoms that have started since her visit here on 09/06/23. This RN verified patient using two identifiers. Pt was seen here and treated for UTI on 12/11, pt reports that she had vaginal itching that started afterwards and did a virtual appointment and took the two Diflucan that was prescribed but still having vaginal itching. This RN spoke with Angie, PA, about patient and her new symptoms. Was advised to instruct patient from PA that could try OTC Mona-stat or would need to come back to be re-seen. This RN made patient aware, patient verbalized understanding.

## 2023-09-17 ENCOUNTER — Telehealth: Payer: Medicaid Other | Admitting: Family Medicine

## 2023-09-17 DIAGNOSIS — N898 Other specified noninflammatory disorders of vagina: Secondary | ICD-10-CM

## 2023-09-17 NOTE — Progress Notes (Signed)
Because Ms. Boys, I feel your condition warrants further evaluation and I recommend that you be seen in a face to face visit.   NOTE: There will be NO CHARGE for this eVisit   If you are having a true medical emergency please call 911.      For an urgent face to face visit, [facility] has eight urgent care centers for your convenience:   NEW!! [facility] Center at [facility] Get Driving Directions [phone] [address], [address] [place], [address]    [facility] at [facility] Get Driving Directions [phone] [address] [place], [place] [address]   [facility] Health Urgent Care Center [facility]) Get Driving Directions [phone] [address] [place], [place] [address]  [facility] Urgent Care Center [facility]) Get Driving Directions [phone] [address] [place],  [place]  [address]  [facility] Urgent Care Center [facility] - at [employer]  [phone] [address] [place],  [place] [address]   [facility] Health Urgent Care at [facility] Get Driving Directions [phone] [address], [address] [place], [place] [address]   [facility] Health Urgent Care at [facility] Get Driving Directions  [phone] [address] [place], [place] [address]   [facility] Health Urgent Care at [facility] Directions [phone] [address], [address] [place], [place] [address]  Your MyChart E-visit questionnaire answers were reviewed by a board certified advanced clinical practitioner to complete your personal care plan based on your specific symptoms.  Thank you for using e-Visits.   have provided 5 minutes of non face to face time during this encounter for chart review and documentation.

## 2023-10-17 ENCOUNTER — Other Ambulatory Visit (HOSPITAL_BASED_OUTPATIENT_CLINIC_OR_DEPARTMENT_OTHER): Payer: Self-pay

## 2023-11-17 ENCOUNTER — Other Ambulatory Visit (HOSPITAL_BASED_OUTPATIENT_CLINIC_OR_DEPARTMENT_OTHER): Payer: Self-pay

## 2023-12-13 ENCOUNTER — Ambulatory Visit (HOSPITAL_BASED_OUTPATIENT_CLINIC_OR_DEPARTMENT_OTHER)
Admission: EM | Admit: 2023-12-13 | Discharge: 2023-12-13 | Disposition: A | Discharge status: Home / Self Care | Attending: Family Medicine | Admitting: Family Medicine

## 2023-12-13 ENCOUNTER — Other Ambulatory Visit: Payer: Self-pay

## 2023-12-13 ENCOUNTER — Encounter (HOSPITAL_BASED_OUTPATIENT_CLINIC_OR_DEPARTMENT_OTHER): Payer: Self-pay | Admitting: Emergency Medicine

## 2023-12-13 DIAGNOSIS — E860 Dehydration: Secondary | ICD-10-CM | POA: Diagnosis not present

## 2023-12-13 DIAGNOSIS — R1084 Generalized abdominal pain: Secondary | ICD-10-CM | POA: Diagnosis not present

## 2023-12-13 MED ORDER — ONDANSETRON HCL 4 MG/2ML IJ SOLN
4.0000 mg | Freq: Once | INTRAMUSCULAR | Status: AC
  Administered 2023-12-13: 4 mg via INTRAVENOUS

## 2023-12-13 MED ORDER — SODIUM CHLORIDE 0.9 % IV BOLUS
1000.0000 mL | Freq: Once | INTRAVENOUS | Status: AC
  Administered 2023-12-13: 1000 mL via INTRAVENOUS

## 2023-12-13 MED ORDER — SODIUM CHLORIDE 0.9 % IV SOLN: 4.0000 mg | Freq: Once | INTRAVENOUS | Status: DC

## 2023-12-13 NOTE — ED Provider Notes (Signed)
 [facility]    CSN: [id number] Arrival date & time: 12/13/23  1531      History   Chief Complaint Chief Complaint  Patient presents with   Abdominal Pain   Nausea    HPI Yolanda Johnston is a 38 y.o. female.   Patient is a 38 year old female that presents today with approximately 2 to 3 days of nausea, vomiting and abdominal pain.  The abdominal pain is generalized.  She was also diagnosed with UTI and started on antibiotics on Monday.  She also ate some chicken livers from [facility].  Last BM was yesterday.  No fever, chills, body aches.  Sitting ginger ale but unable to keep any food or fluids down.  Took Zofran and vomited.   Abdominal Pain Associated symptoms: nausea and vomiting     Past Medical History:  Diagnosis Date   Asthma     Patient Active Problem List   Diagnosis Date Noted   Indication for care or intervention related to labor and delivery 07/04/2016    Past Surgical History:  Procedure Laterality Date   NO PAST SURGERIES     TUBAL LIGATION N/A 07/05/2016   Procedure: POST PARTUM TUBAL LIGATION;  Surgeon: Silverio Lay, MD;  Location: [facility];  Service: Gynecology;  Laterality: N/A;    OB History     Gravida  5   Para  4   Term  4   Preterm      AB  1   Living  4      SAB      IAB  1   Ectopic      Multiple  0   Live Births  1            Home Medications    Prior to Admission medications   Medication Sig Start Date End Date Taking? Authorizing Provider  albuterol (PROVENTIL HFA;VENTOLIN HFA) 108 (90 Base) MCG/ACT inhaler Inhale 2 puffs into the lungs every 6 (six) hours as needed for wheezing or shortness of breath.    [provider]  calcium carbonate (TUMS - DOSED IN MG ELEMENTAL CALCIUM) 500 MG chewable tablet Chew 2 tablets by mouth as needed for indigestion or heartburn.     [provider]  ibuprofen (ADVIL,MOTRIN) 600 MG tablet Take 1 tablet (600 mg total) by mouth every 6 (six) hours.  07/06/16   Hoover Browns, MD  Prenat-FeAsp-Meth-FA-DHA w/o A (PRENATE PIXIE) 10-0.6-0.4-200 MG CAPS Take 1 capsule by mouth daily.    [provider]    Family History History reviewed. No pertinent family history.  Social History Social History   Tobacco Use   Smoking status: Former    Current packs/day: 0.00    Types: Cigarettes    Quit date: 03/26/2016    Years since quitting: 7.7   Smokeless tobacco: Never  Substance Use Topics   Alcohol use: No   Drug use: No     Allergies   Patient has no known allergies.   Review of Systems Review of Systems  Gastrointestinal:  Positive for abdominal pain, nausea and vomiting.     Physical Exam Triage Vital Signs ED Triage Vitals [12/13/23 1543]  Encounter Vitals Group     BP 118/84     Systolic BP Percentile      Diastolic BP Percentile      Pulse Rate 92     Resp 18     Temp 98.2 F (36.8 C)     Temp Source Oral  SpO2 99 %     Weight      Height      Head Circumference      Peak Flow      Pain Score 8     Pain Loc      Pain Education      Exclude from Growth Chart    No data found.  Updated Vital Signs BP (!) 138/90 (BP Location: Right Arm)   Pulse 84   Temp 98.2 F (36.8 C) (Oral)   Resp 20   SpO2 98%   Visual Acuity Right Eye Distance:   Left Eye Distance:   Bilateral Distance:    Right Eye Near:   Left Eye Near:    Bilateral Near:     Physical Exam Vitals and nursing note reviewed.  Constitutional:      General: She is not in acute distress.    Appearance: She is well-developed. She is ill-appearing. She is not toxic-appearing.  Abdominal:     General: Bowel sounds are decreased.     Palpations: Abdomen is soft.     Tenderness: There is generalized abdominal tenderness. There is no right CVA tenderness, left CVA tenderness, guarding or rebound.       Comments: TTP   Neurological:     Mental Status: She is alert.      UC Treatments / Results  Labs (all labs ordered are  listed, but only abnormal results are displayed) Labs Reviewed - No data to display   EKG   Radiology No results found.  Procedures Procedures (including critical care time)  Medications Ordered in UC Medications  sodium chloride 0.9 % bolus 1,000 mL (0 mLs Intravenous Stopped 12/13/23 1704)  ondansetron (ZOFRAN) injection 4 mg (4 mg Intravenous Given 12/13/23 1623)    Initial Impression / Assessment and Plan / UC Course  I have reviewed the triage vital signs and the nursing notes.  Pertinent labs & imaging results that were available during my care of the patient were reviewed by me and considered in my medical decision making (see chart for details).     Generalized abdominal pain, dehydration-no specific concerns on exam.  No concern for acute abdomen.  Most likely combination  of things based off of taking antibiotics and potentially eating some bad food.  PCP  called her while here in the clinic and said that urine culture was negative so doubt origin from that. Gave fluids here to rehydrate and Zofran for nausea, vomiting. Was able to tolerate ice chips prior to leaving and felt better Recommend stop antibiotics, sip fluids and advance diet as tolerated with bland foods. Also recommended if abdominal pain gets worse to go to [facility] ER for further evaluation Final Clinical Impressions(s) / UC Diagnoses   Final diagnoses:  Generalized abdominal pain  Dehydration     Discharge Instructions      I believe that your pain is from a viral illness or something that you ingested. Antibiotics can cause stomach upset also. Stop the antibiotics since your culture didn't show anything.  We have given you some fluids here to rehydrate you and some nausea medication. Try to get some Gatorade or Pedialyte and sip this at home.  If your stomach pain worsens you need to go to the ER at [facility].     ED Prescriptions   None    PDMP not reviewed this encounter.   Janace Aris, FNP 12/13/23 1730

## 2023-12-13 NOTE — ED Notes (Signed)
 Patient reporting feeling slightly better. Provided patient ice chips.  saline LIB.

## 2023-12-13 NOTE — Discharge Instructions (Addendum)
 I believe that your pain is from a viral illness or something that you ingested. Antibiotics can cause stomach upset also. Stop the antibiotics since your culture didn't show anything.  We have given you some fluids here to rehydrate you and some nausea medication. Try to get some Gatorade or Pedialyte and sip this at home.  If your stomach pain worsens you need to go to the ER at [facility].

## 2023-12-13 NOTE — ED Triage Notes (Signed)
 Pt reports abd pain, nausea and vomiting since Monday, not able to keep any food down. Reports no BM since yesterday morning.

## 2024-02-08 ENCOUNTER — Encounter (INDEPENDENT_AMBULATORY_CARE_PROVIDER_SITE_OTHER): Payer: Self-pay

## 2024-02-27 ENCOUNTER — Encounter (INDEPENDENT_AMBULATORY_CARE_PROVIDER_SITE_OTHER): Payer: Self-pay

## 2024-03-25 ENCOUNTER — Encounter (INDEPENDENT_AMBULATORY_CARE_PROVIDER_SITE_OTHER): Payer: Self-pay

## 2024-06-13 ENCOUNTER — Encounter (INDEPENDENT_AMBULATORY_CARE_PROVIDER_SITE_OTHER): Payer: Self-pay
